# Patient Record
Sex: Female | Born: 1945 | Race: White | Hispanic: No | Smoking: Former smoker
Health system: Southern US, Community
[De-identification: ages and names within clinical notes are randomized; demographics above are authoritative.]

## PROBLEM LIST (undated history)

## (undated) DIAGNOSIS — E119 Type 2 diabetes mellitus without complications: Secondary | ICD-10-CM

## (undated) DIAGNOSIS — E785 Hyperlipidemia, unspecified: Secondary | ICD-10-CM

## (undated) DIAGNOSIS — I10 Essential (primary) hypertension: Secondary | ICD-10-CM

## (undated) DIAGNOSIS — H269 Unspecified cataract: Secondary | ICD-10-CM

## (undated) HISTORY — PX: OTHER SURGICAL HISTORY: SHX169

## (undated) HISTORY — DX: Essential (primary) hypertension: I10

## (undated) HISTORY — PX: TUBAL LIGATION: SHX77

## (undated) HISTORY — DX: Unspecified cataract: H26.9

## (undated) HISTORY — DX: Hyperlipidemia, unspecified: E78.5

## (undated) HISTORY — PX: POLYPECTOMY: SHX149

## (undated) HISTORY — DX: Type 2 diabetes mellitus without complications: E11.9

## (undated) HISTORY — PX: ROTATOR CUFF REPAIR: SHX139

## (undated) HISTORY — PX: BUNIONECTOMY: SHX129

---

## 2014-02-07 HISTORY — PX: COLONOSCOPY: SHX174

## 2014-03-19 ENCOUNTER — Telehealth: Payer: Self-pay | Admitting: Family Medicine

## 2014-03-19 NOTE — Telephone Encounter (Signed)
Sorry but I am too full  

## 2014-03-19 NOTE — Telephone Encounter (Addendum)
Pt would like to know if you will accept as a new pt? Other family members see you as well.  Pt states she has spoken with you.

## 2014-03-21 NOTE — Telephone Encounter (Signed)
Pt is aware and states she will talk to MD herself.

## 2014-12-10 ENCOUNTER — Ambulatory Visit
Admission: RE | Admit: 2014-12-10 | Discharge: 2014-12-10 | Disposition: A | Discharge status: Home / Self Care | Payer: PRIVATE HEALTH INSURANCE | Source: Ambulatory Visit | Attending: Family Medicine | Admitting: Family Medicine

## 2014-12-10 ENCOUNTER — Other Ambulatory Visit: Payer: Self-pay | Admitting: Family Medicine

## 2014-12-10 DIAGNOSIS — M7541 Impingement syndrome of right shoulder: Secondary | ICD-10-CM

## 2014-12-16 ENCOUNTER — Other Ambulatory Visit: Payer: Self-pay | Admitting: Family Medicine

## 2014-12-16 DIAGNOSIS — M25511 Pain in right shoulder: Secondary | ICD-10-CM

## 2014-12-16 DIAGNOSIS — M7541 Impingement syndrome of right shoulder: Secondary | ICD-10-CM

## 2014-12-23 ENCOUNTER — Ambulatory Visit
Admission: RE | Admit: 2014-12-23 | Discharge: 2014-12-23 | Disposition: A | Discharge status: Home / Self Care | Payer: Medicare Other | Source: Ambulatory Visit | Attending: Family Medicine | Admitting: Family Medicine

## 2014-12-23 DIAGNOSIS — M7541 Impingement syndrome of right shoulder: Secondary | ICD-10-CM

## 2014-12-23 DIAGNOSIS — M25511 Pain in right shoulder: Secondary | ICD-10-CM

## 2015-01-08 ENCOUNTER — Other Ambulatory Visit: Payer: PRIVATE HEALTH INSURANCE

## 2015-04-01 ENCOUNTER — Other Ambulatory Visit: Payer: Self-pay | Admitting: Family Medicine

## 2015-04-01 DIAGNOSIS — R5381 Other malaise: Secondary | ICD-10-CM

## 2015-04-11 ENCOUNTER — Other Ambulatory Visit: Payer: Self-pay | Admitting: Family Medicine

## 2015-04-11 ENCOUNTER — Other Ambulatory Visit: Payer: Self-pay | Admitting: Internal Medicine

## 2015-04-11 DIAGNOSIS — Z1231 Encounter for screening mammogram for malignant neoplasm of breast: Secondary | ICD-10-CM

## 2015-04-11 DIAGNOSIS — M8580 Other specified disorders of bone density and structure, unspecified site: Secondary | ICD-10-CM

## 2015-04-23 ENCOUNTER — Ambulatory Visit
Admission: RE | Admit: 2015-04-23 | Discharge: 2015-04-23 | Disposition: A | Discharge status: Home / Self Care | Payer: Medicare Other | Source: Ambulatory Visit | Attending: Internal Medicine | Admitting: Internal Medicine

## 2015-04-23 DIAGNOSIS — M8580 Other specified disorders of bone density and structure, unspecified site: Secondary | ICD-10-CM

## 2015-04-23 DIAGNOSIS — Z1231 Encounter for screening mammogram for malignant neoplasm of breast: Secondary | ICD-10-CM

## 2016-04-14 ENCOUNTER — Other Ambulatory Visit: Payer: Self-pay

## 2016-04-14 DIAGNOSIS — Z1231 Encounter for screening mammogram for malignant neoplasm of breast: Secondary | ICD-10-CM

## 2016-04-30 ENCOUNTER — Ambulatory Visit: Payer: Medicare Other

## 2016-05-04 ENCOUNTER — Ambulatory Visit
Admission: RE | Admit: 2016-05-04 | Discharge: 2016-05-04 | Disposition: A | Discharge status: Home / Self Care | Payer: Medicare Other | Source: Ambulatory Visit

## 2016-05-04 DIAGNOSIS — Z1231 Encounter for screening mammogram for malignant neoplasm of breast: Secondary | ICD-10-CM

## 2017-04-25 ENCOUNTER — Other Ambulatory Visit: Payer: Self-pay | Admitting: Internal Medicine

## 2017-04-25 DIAGNOSIS — Z1231 Encounter for screening mammogram for malignant neoplasm of breast: Secondary | ICD-10-CM

## 2017-05-05 ENCOUNTER — Encounter: Payer: Self-pay | Admitting: Radiology

## 2017-05-05 ENCOUNTER — Ambulatory Visit
Admission: RE | Admit: 2017-05-05 | Discharge: 2017-05-05 | Disposition: A | Discharge status: Home / Self Care | Payer: Medicare Other | Source: Ambulatory Visit | Attending: Internal Medicine | Admitting: Internal Medicine

## 2017-05-05 DIAGNOSIS — Z1231 Encounter for screening mammogram for malignant neoplasm of breast: Secondary | ICD-10-CM

## 2017-11-15 HISTORY — PX: CATARACT EXTRACTION: SUR2

## 2018-04-11 ENCOUNTER — Other Ambulatory Visit: Payer: Self-pay | Admitting: Internal Medicine

## 2018-04-11 DIAGNOSIS — Z1231 Encounter for screening mammogram for malignant neoplasm of breast: Secondary | ICD-10-CM

## 2018-05-08 ENCOUNTER — Ambulatory Visit
Admission: RE | Admit: 2018-05-08 | Discharge: 2018-05-08 | Disposition: A | Discharge status: Home / Self Care | Payer: Medicare Other | Source: Ambulatory Visit | Attending: Internal Medicine | Admitting: Internal Medicine

## 2018-05-08 DIAGNOSIS — Z1231 Encounter for screening mammogram for malignant neoplasm of breast: Secondary | ICD-10-CM

## 2018-12-18 ENCOUNTER — Telehealth: Payer: Self-pay | Admitting: Gastroenterology

## 2018-12-19 ENCOUNTER — Encounter: Payer: Self-pay | Admitting: Gastroenterology

## 2019-01-16 ENCOUNTER — Ambulatory Visit (AMBULATORY_SURGERY_CENTER): Payer: Self-pay

## 2019-01-16 ENCOUNTER — Encounter: Payer: Self-pay | Admitting: Gastroenterology

## 2019-01-16 VITALS — Ht 65.5 in | Wt 199.6 lb

## 2019-01-16 DIAGNOSIS — Z86010 Personal history of colonic polyps: Secondary | ICD-10-CM

## 2019-01-16 MED ORDER — NA SULFATE-K SULFATE-MG SULF 17.5-3.13-1.6 GM/177ML PO SOLN: 1.0000 | Freq: Once | ORAL | 0 refills | Status: AC

## 2019-01-16 NOTE — Progress Notes (Signed)
Denies allergies to eggs or soy products. Denies complication of anesthesia or sedation. Denies use of weight loss medication. Denies use of O2.   Emmi instructions declined.  

## 2019-01-29 ENCOUNTER — Telehealth: Payer: Self-pay | Admitting: Gastroenterology

## 2019-01-29 NOTE — Telephone Encounter (Signed)
Hi Dr. [name] Plan, this pt just cancelled her procedure that was scheduled for tomorrow with you due to concerns about the coronavirus, she stated to have relatives who are healthcare professionals who advised her to do that. She will call back to reschedule. Thank you.

## 2019-01-29 NOTE — Telephone Encounter (Signed)
OK No charge 

## 2019-01-30 ENCOUNTER — Encounter: Payer: Medicare Other | Admitting: Gastroenterology

## 2019-04-03 ENCOUNTER — Other Ambulatory Visit: Payer: Self-pay | Admitting: Internal Medicine

## 2019-04-03 DIAGNOSIS — Z1231 Encounter for screening mammogram for malignant neoplasm of breast: Secondary | ICD-10-CM

## 2019-04-06 ENCOUNTER — Encounter: Payer: Self-pay | Admitting: Gastroenterology

## 2019-04-16 DIAGNOSIS — E119 Type 2 diabetes mellitus without complications: Secondary | ICD-10-CM | POA: Insufficient documentation

## 2019-04-16 DIAGNOSIS — I10 Essential (primary) hypertension: Secondary | ICD-10-CM | POA: Insufficient documentation

## 2019-04-23 NOTE — Telephone Encounter (Signed)
Pt scheduled for 05/08/2019

## 2019-04-25 ENCOUNTER — Ambulatory Visit: Payer: Medicare Other | Admitting: *Deleted

## 2019-04-25 ENCOUNTER — Other Ambulatory Visit: Payer: Self-pay

## 2019-04-25 VITALS — Ht 65.5 in | Wt 196.0 lb

## 2019-04-25 DIAGNOSIS — Z86010 Personal history of colonic polyps: Secondary | ICD-10-CM

## 2019-04-25 NOTE — Progress Notes (Signed)
Patient's pre-visit was done today over the phone with the patient due to Covid-19. Name,DOB and address verified. Insurance verified. Packet of Prep instructions mailed to patient including copy of a consent form and pre-procedure patient acknowledgement form-pt is aware. Patient has Suprep at home. Patient understands to call us back with any questions or concerns.   Patient denies any allergies to eggs or soy. Patient denies any problems with anesthesia/sedation. Patient denies any oxygen use at home. Patient denies taking any diet/weight loss medications or blood thinners. EMMI education assisgned to patient on colonoscopy, this was explained and instructions given to patient.

## 2019-05-07 ENCOUNTER — Telehealth: Payer: Self-pay | Admitting: Gastroenterology

## 2019-05-07 NOTE — Telephone Encounter (Signed)

## 2019-05-08 ENCOUNTER — Ambulatory Visit (AMBULATORY_SURGERY_CENTER): Payer: Medicare Other | Admitting: Gastroenterology

## 2019-05-08 ENCOUNTER — Encounter: Payer: Self-pay | Admitting: Gastroenterology

## 2019-05-08 ENCOUNTER — Other Ambulatory Visit: Payer: Self-pay

## 2019-05-08 VITALS — BP 109/67 | HR 70 | Temp 98.8°F | Resp 15 | Ht 65.0 in | Wt 196.0 lb

## 2019-05-08 DIAGNOSIS — D123 Benign neoplasm of transverse colon: Secondary | ICD-10-CM | POA: Diagnosis not present

## 2019-05-08 DIAGNOSIS — D122 Benign neoplasm of ascending colon: Secondary | ICD-10-CM | POA: Diagnosis not present

## 2019-05-08 DIAGNOSIS — Z86010 Personal history of colonic polyps: Secondary | ICD-10-CM | POA: Diagnosis not present

## 2019-05-08 DIAGNOSIS — K635 Polyp of colon: Secondary | ICD-10-CM

## 2019-05-08 MED ORDER — SODIUM CHLORIDE 0.9 % IV SOLN: 500.0000 mL | Freq: Once | INTRAVENOUS | Status: DC

## 2019-05-08 NOTE — Patient Instructions (Signed)
NO NSAIDS OR ASPIRIN FOR 2 WEEKS.  PLEASE TAKE TYLENOL (ACETAMINOPHEN) OTC IF NEEDED FOR PAIN. NO IBUPROFEN, ALEVE, NAPROSYN, GOODY POWDERS FOR 2 WEEKS. PLEASE FOLLOW A HIGH FIBER DIET.    YOU HAD AN ENDOSCOPIC PROCEDURE TODAY AT [facility]:   Refer to the procedure report that was given to you for any specific questions about what was found during the examination.  If the procedure report does not answer your questions, please call your gastroenterologist to clarify.  If you requested that your care partner not be given the details of your procedure findings, then the procedure report has been included in a sealed envelope for you to review at your convenience later.  YOU SHOULD EXPECT: Some feelings of bloating in the abdomen. Passage of more gas than usual.  Walking can help get rid of the air that was put into your GI tract during the procedure and reduce the bloating. If you had a lower endoscopy (such as a colonoscopy or flexible sigmoidoscopy) you may notice spotting of blood in your stool or on the toilet paper. If you underwent a bowel prep for your procedure, you may not have a normal bowel movement for a few days.  Please Note:  You might notice some irritation and congestion in your nose or some drainage.  This is from the oxygen used during your procedure.  There is no need for concern and it should clear up in a day or so.  SYMPTOMS TO REPORT IMMEDIATELY:   Following lower endoscopy (colonoscopy or flexible sigmoidoscopy):  Excessive amounts of blood in the stool  Significant tenderness or worsening of abdominal pains  Swelling of the abdomen that is new, acute  Fever of 100F or higher   For urgent or emergent issues, a gastroenterologist can be reached at any hour by calling [phone].   DIET:  We do recommend a small meal at first, but then you may proceed to your regular diet.  Drink plenty of fluids but you should avoid alcoholic beverages for 24  hours.  ACTIVITY:  You should plan to take it easy for the rest of today and you should NOT DRIVE or use heavy machinery until tomorrow (because of the sedation medicines used during the test).    FOLLOW UP: Our staff will call the number listed on your records 48-72 hours following your procedure to check on you and address any questions or concerns that you may have regarding the information given to you following your procedure. If we do not reach you, we will leave a message.  We will attempt to reach you two times.  During this call, we will ask if you have developed any symptoms of COVID 19. If you develop any symptoms (ie: fever, flu-like symptoms, shortness of breath, cough etc.) before then, please call [phone].  If you test positive for Covid 19 in the 2 weeks post procedure, please call and report this information to [place].    If any biopsies were taken you will be contacted by phone or by letter within the next 1-3 weeks.  Please call us at [phone] if you have not heard about the biopsies in 3 weeks.    SIGNATURES/CONFIDENTIALITY: You and/or your care partner have signed paperwork which will be entered into your electronic medical record.  These signatures attest to the fact that that the information above on your After Visit Summary has been reviewed and is understood.  Full responsibility of the confidentiality of this  discharge information lies with you and/or your care-partner. 

## 2019-05-08 NOTE — Progress Notes (Signed)
Pt. Reports no change in her medical or surgical history since her pre-visit 04/25/2019.  VS and Temp checked by Raven and JB.

## 2019-05-08 NOTE — Progress Notes (Signed)
Report to PACU, RN, vss, BBS= Clear.  

## 2019-05-08 NOTE — Op Note (Addendum)
[facility] Patient Name: Kelly Jordan Procedure Date: 05/08/2019 7:40 AM MRN: [id number] Endoscopist: Ladene Artist , MD Age: 73 Referring MD:  Date of Birth: [birth year] Gender: Female Account #: [id number] Procedure:                Colonoscopy Indications:              Surveillance: Personal history of adenomatous                            polyps and SSA on last colonoscopy 5 years ago Medicines:                Monitored Anesthesia Care Procedure:                Pre-Anesthesia Assessment:                           - Prior to the procedure, a History and Physical                            was performed, and patient medications and                            allergies were reviewed. The patient's tolerance of                            previous anesthesia was also reviewed. The risks                            and benefits of the procedure and the sedation                            options and risks were discussed with the patient.                            All questions were answered, and informed consent                            was obtained. Prior Anticoagulants: The patient has                            taken no previous anticoagulant or antiplatelet                            agents. ASA Grade Assessment: II - A patient with                            mild systemic disease. After reviewing the risks                            and benefits, the patient was deemed in                            satisfactory condition to undergo the procedure.  After obtaining informed consent, the colonoscope                            was passed under direct vision. Throughout the                            procedure, the patient's blood pressure, pulse, and                            oxygen saturations were monitored continuously. The                            Colonoscope was introduced through the anus and                            advanced to the  the cecum, identified by                            appendiceal orifice and ileocecal valve. The                            ileocecal valve, appendiceal orifice, and rectum                            were photographed. The quality of the bowel                            preparation was excellent. The colonoscopy was                            performed without difficulty. The patient tolerated                            the procedure well. Scope In: 8:09:41 AM Scope Out: 8:29:15 AM Scope Withdrawal Time: 0 hours 16 minutes 47 seconds  Total Procedure Duration: 0 hours 19 minutes 34 seconds  Findings:                 The perianal and digital rectal examinations were                            normal.                           A 10 mm polyp was found in the ascending colon. The                            polyp was sessile. The polyp was removed with a hot                            snare. Resection and retrieval were complete.                           A 4 mm polyp was found in the ascending colon. The  polyp was sessile. The polyp was removed with a                            cold biopsy forceps. Resection and retrieval were                            complete.                           A 7 mm polyp was found in the transverse colon. The                            polyp was sessile. The polyp was removed with a                            cold snare. Resection and retrieval were complete.                           A few small-mouthed diverticula were found in the                            sigmoid colon.                           Anal papilla(e) were hypertrophied.                           Internal hemorrhoids were found during                            retroflexion. The hemorrhoids were small and Grade                            I (internal hemorrhoids that do not prolapse).                           The colon otherwise appeared normal on direct and                             retroflexion views. Complications:            No immediate complications. Estimated blood loss:                            None. Estimated Blood Loss:     Estimated blood loss: none. Impression:               - One 10 mm polyp in the ascending colon, removed                            with a hot snare. Resected and retrieved.                           - One 4 mm polyp in the ascending colon, removed  with a cold biopsy forceps. Resected and retrieved.                           - One 7 mm polyp in the transverse colon, removed                            with a cold snare. Resected and retrieved.                           - Mild diverticulosis in the sigmoid colon.                           - Anal papilla(e) were hypertrophied.                           - Internal hemorrhoids.                           - The colon otherwise appeared normal on direct and                            retroflexion views. Recommendation:           - Repeat colonoscopy in 3 - 5 years for                            surveillance pending patholgyy review.                           - Patient has a contact number available for                            emergencies. The signs and symptoms of potential                            delayed complications were discussed with the                            patient. Return to normal activities tomorrow.                            Written discharge instructions were provided to the                            patient.                           - High fiber diet.                           - Continue present medications.                           - Await pathology results.                           - No  aspirin, ibuprofen, naproxen, or other                            non-steroidal anti-inflammatory drugs for 2 weeks                            after polyp removal. Ladene Artist, MD 05/08/2019 8:35:10 AM This report has been signed  electronically.

## 2019-05-08 NOTE — Progress Notes (Signed)
Called to room to assist during endoscopic procedure.  Patient ID and intended procedure confirmed with present staff. Received instructions for my participation in the procedure from the performing physician.  

## 2019-05-10 ENCOUNTER — Other Ambulatory Visit: Payer: Self-pay

## 2019-05-10 ENCOUNTER — Other Ambulatory Visit: Payer: Self-pay | Admitting: Podiatry

## 2019-05-10 ENCOUNTER — Ambulatory Visit: Payer: Medicare Other | Admitting: Podiatry

## 2019-05-10 ENCOUNTER — Encounter: Payer: Self-pay | Admitting: Podiatry

## 2019-05-10 ENCOUNTER — Ambulatory Visit (INDEPENDENT_AMBULATORY_CARE_PROVIDER_SITE_OTHER): Payer: Medicare Other

## 2019-05-10 ENCOUNTER — Telehealth: Payer: Self-pay

## 2019-05-10 VITALS — BP 141/76 | HR 73 | Temp 97.1°F | Resp 16

## 2019-05-10 DIAGNOSIS — M76821 Posterior tibial tendinitis, right leg: Secondary | ICD-10-CM | POA: Diagnosis not present

## 2019-05-10 DIAGNOSIS — M7750 Other enthesopathy of unspecified foot: Secondary | ICD-10-CM

## 2019-05-10 MED ORDER — MELOXICAM 15 MG PO TABS: 15.0000 mg | ORAL_TABLET | Freq: Every day | ORAL | 3 refills | Status: DC

## 2019-05-10 NOTE — Progress Notes (Signed)
Subjective:  Patient ID: Kelly Jordan, female    DOB: 02-Mar-1946,  MRN: [id number] HPI Chief Complaint  Patient presents with  . Foot Pain    Medial ankle right - aching, burning x 6 months, swelling on medial and lateral side of ankle, no injury, taking advil as needed  . New Patient (Initial Visit)    73 y.o. female presents with the above complaint.   ROS: Denies fever chills nausea vomiting muscle aches pains calf pain back pain chest pain shortness of breath.  Past Medical History:  Diagnosis Date  . Cataract   . Diabetes mellitus without complication ([place])   . Hyperlipidemia   . Hypertension    Past Surgical History:  Procedure Laterality Date  . Blefoplasty  Bilateral   . BUNIONECTOMY Bilateral   . CATARACT EXTRACTION Bilateral 2019  . COLONOSCOPY  02/07/2014  . POLYPECTOMY    . ROTATOR CUFF REPAIR Right     Current Outpatient Medications:  .  aspirin EC 81 MG tablet, Take 81 mg by mouth daily., Disp: , Rfl:  .  glipiZIDE (GLUCOTROL) 5 MG tablet, Take by mouth daily before breakfast., Disp: , Rfl:  .  meloxicam (MOBIC) 15 MG tablet, Take 1 tablet (15 mg total) by mouth daily., Disp: 30 tablet, Rfl: 3 .  metFORMIN (GLUCOPHAGE) 1000 MG tablet, Take 1,000 mg by mouth 2 (two) times daily with a meal., Disp: , Rfl:  .  rosuvastatin (CRESTOR) 10 MG tablet, Take 10 mg by mouth daily., Disp: , Rfl:  .  sitaGLIPtin (JANUVIA) 100 MG tablet, Take 100 mg by mouth daily., Disp: , Rfl:  .  telmisartan (MICARDIS) 20 MG tablet, Take 20 mg by mouth daily., Disp: , Rfl:   No Known Allergies Review of Systems Objective:   Vitals:   05/10/19 1325  BP: (!) 141/76  Pulse: 73  Resp: 16  Temp: (!) 97.1 F (36.2 C)    General: Well developed, nourished, in no acute distress, alert and oriented x3   Dermatological: Skin is warm, dry and supple bilateral. Nails x 10 are well maintained; remaining integument appears unremarkable at this time. There are no open sores, no  preulcerative lesions, no rash or signs of infection present.  Vascular: Dorsalis Pedis artery and Posterior Tibial artery pedal pulses are 2/4 bilateral with immedate capillary fill time. Pedal hair growth present. No varicosities and no lower extremity edema present bilateral.   Neruologic: Grossly intact via light touch bilateral. Vibratory intact via tuning fork bilateral. Protective threshold with Semmes Wienstein monofilament intact to all pedal sites bilateral. Patellar and Achilles deep tendon reflexes 2+ bilateral. No Babinski or clonus noted bilateral.   Musculoskeletal: No gross boney pedal deformities bilateral. No pain, crepitus, or limitation noted with foot and ankle range of motion bilateral. Muscular strength 5/5 in all groups tested bilateral.  Gait: Unassisted, Nonantalgic.    Radiographs:  Radiographs taken today demonstrate pes planus with dorsal lipping of the distal aspect of the talar head she also has considerable sclerosis of the middle subtalar joint facet.  She also has soft tissue swelling around the posterior tibial tendon and spring ligament easily visible radiograph.  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  Plan: Discussed etiology pathology conservative versus surgical therapies.  At this point I injected the posterior tibial tendon area with 20 mg Kenalog 5 mg Marcaine started on meloxicam 15 mg 1 p.o. daily recommended appropriate shoe gear  compression anklet and a Tri-Lock brace.  I will follow-up with her when she returns from [place].     Kelly Lurry T. [place], [place]

## 2019-05-10 NOTE — Telephone Encounter (Signed)
  Follow up Call-  Call back number 05/08/2019  Post procedure Call Back phone  # [phone]  Permission to leave phone message Yes  Some recent data might be hidden     Patient questions:  Do you have a fever, pain , or abdominal swelling? No. Pain Score  0 *  Have you tolerated food without any problems? Yes.    Have you been able to return to your normal activities? Yes.    Do you have any questions about your discharge instructions: Diet   No. Medications  No. Follow up visit  No.  Do you have questions or concerns about your Care? No.  Actions: * If pain score is 4 or above: No action needed, pain <4.  1. Have you developed a fever since your procedure? no  2.   Have you had an respiratory symptoms (SOB or cough) since your procedure? no  3.   Have you tested positive for COVID 19 since your procedure no  4.   Have you had any family members/close contacts diagnosed with the COVID 19 since your procedure?  no   If yes to any of these questions please route to Joylene John, RN and Alphonsa Gin, Therapist, sports.

## 2019-05-16 ENCOUNTER — Encounter: Payer: Self-pay | Admitting: Gastroenterology

## 2019-06-05 ENCOUNTER — Encounter: Payer: Self-pay | Admitting: Podiatry

## 2019-06-05 ENCOUNTER — Other Ambulatory Visit: Payer: Self-pay

## 2019-06-05 ENCOUNTER — Ambulatory Visit: Payer: Medicare Other | Admitting: Podiatry

## 2019-06-05 VITALS — Temp 97.5°F

## 2019-06-05 DIAGNOSIS — M76821 Posterior tibial tendinitis, right leg: Secondary | ICD-10-CM

## 2019-06-05 NOTE — Progress Notes (Signed)
She presents today for follow-up of her posterior tibial tendinitis right she states is doing so much better.  She states that she was not able to go to [place] because her mother died 2023/06/12.  She states that she has not had any pain as long she wears her brace.  Objective: Vital signs are stable alert and oriented x3.  There is no erythema edema cellulitis drainage odor no fluctuance within the tendon sheath.  Assessment: Resolving posterior tibial tendinitis right.  Plan: Encouraged her to continue to wear the brace for at least another month and I will follow-up with her when she returns from [place] she will be leaving 4 August

## 2019-06-18 ENCOUNTER — Ambulatory Visit
Admission: RE | Admit: 2019-06-18 | Discharge: 2019-06-18 | Disposition: A | Discharge status: Home / Self Care | Payer: Medicare Other | Source: Ambulatory Visit | Attending: Internal Medicine | Admitting: Internal Medicine

## 2019-06-18 ENCOUNTER — Other Ambulatory Visit: Payer: Self-pay

## 2019-06-18 DIAGNOSIS — Z1231 Encounter for screening mammogram for malignant neoplasm of breast: Secondary | ICD-10-CM

## 2019-06-19 ENCOUNTER — Ambulatory Visit: Payer: Medicare Other | Admitting: Podiatry

## 2019-08-20 ENCOUNTER — Other Ambulatory Visit: Payer: Self-pay | Admitting: *Deleted

## 2019-08-20 DIAGNOSIS — Z20822 Contact with and (suspected) exposure to covid-19: Secondary | ICD-10-CM

## 2019-08-22 LAB — NOVEL CORONAVIRUS, NAA: SARS-CoV-2, NAA: NOT DETECTED

## 2019-09-15 IMAGING — MG DIGITAL SCREENING BILATERAL MAMMOGRAM WITH TOMO AND CAD
8 series · 8 of 24 positions shown · non-contrast
Comparison: Previous exam(s).

ACR Breast Density Category a: The breast tissue is almost entirely
fatty.

CLINICAL DATA: Screening.

EXAM:
DIGITAL SCREENING BILATERAL MAMMOGRAM WITH TOMO AND CAD

[R CC synth-2D]
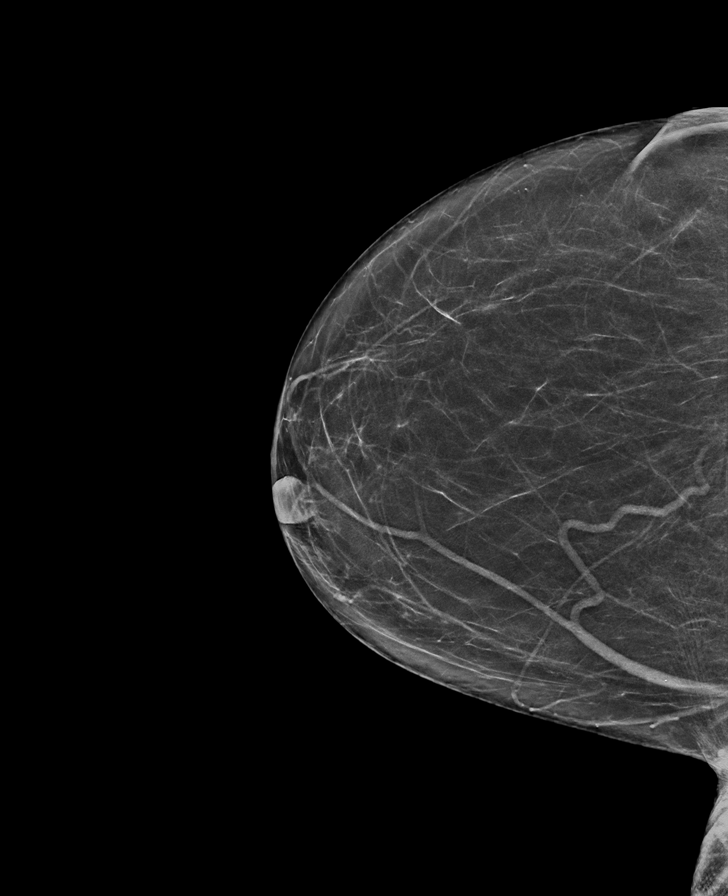

[R MLO synth-2D]
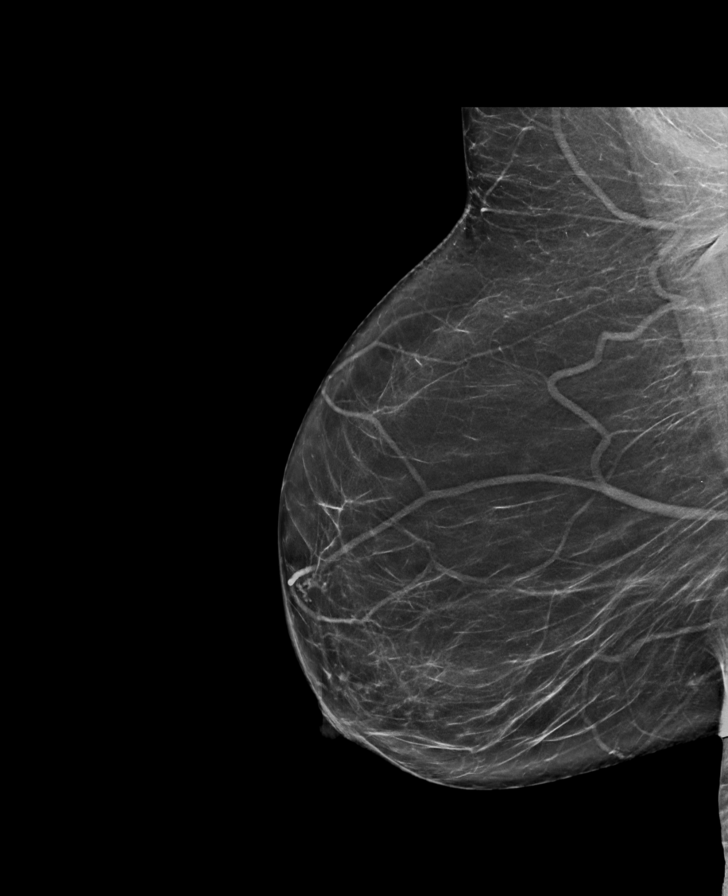

[L CC synth-2D]
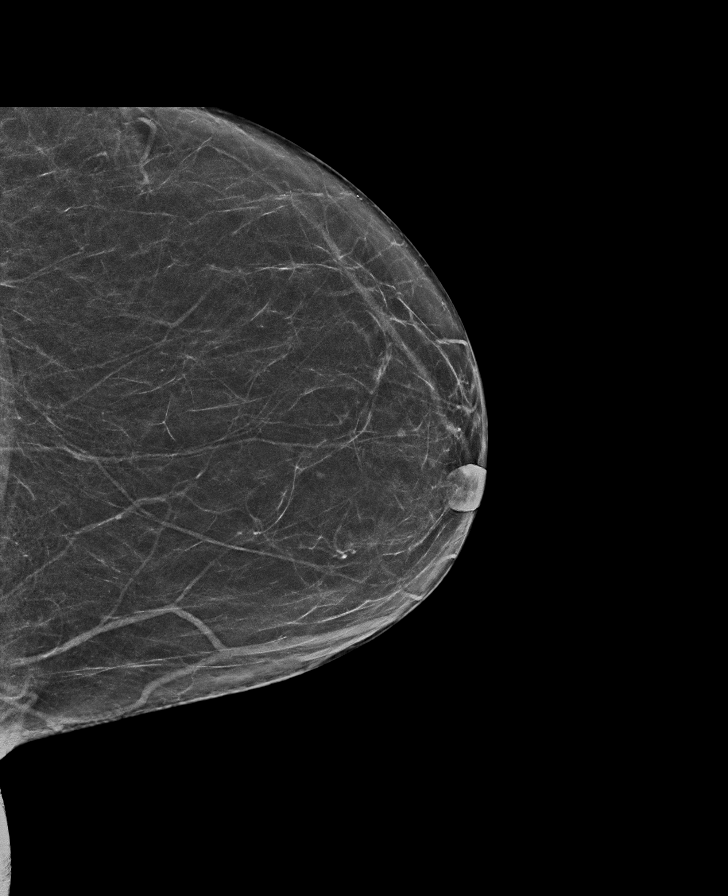

[L MLO synth-2D]
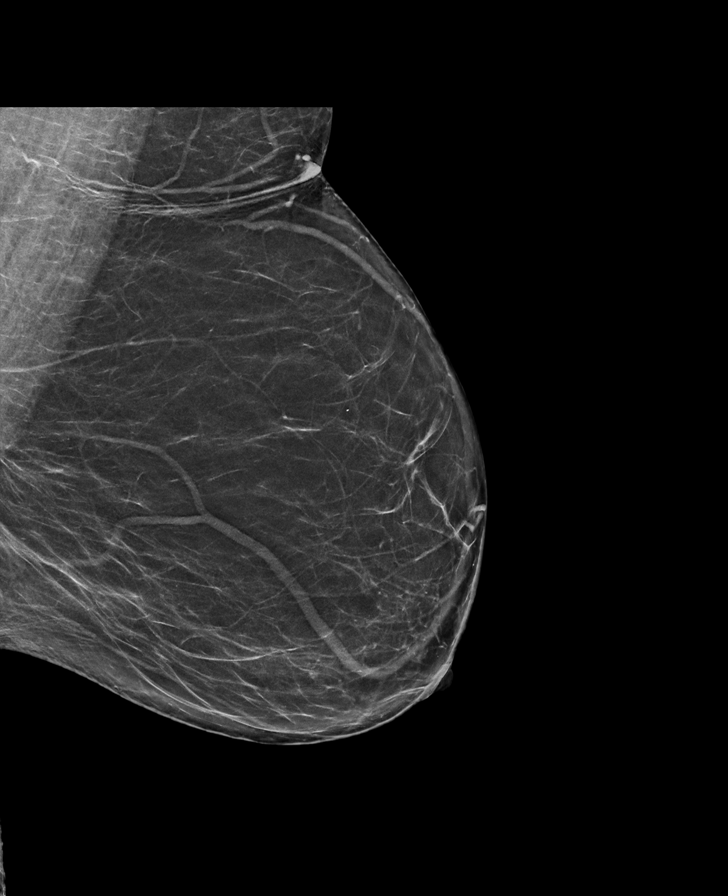

[R CC tomo · tomo slice 33/64.0]
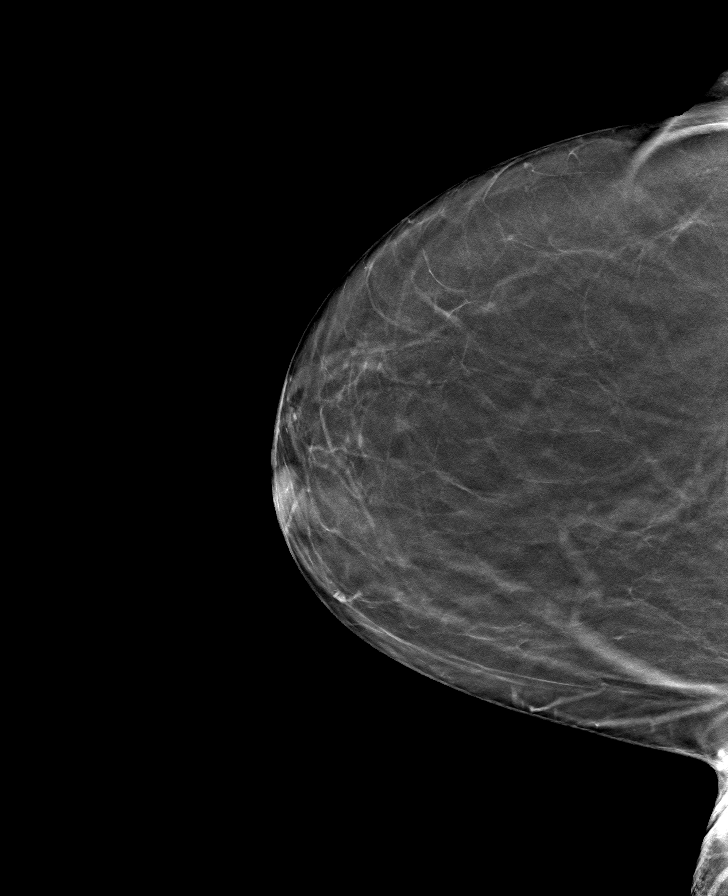

[L MLO tomo · tomo slice 35/69.0]
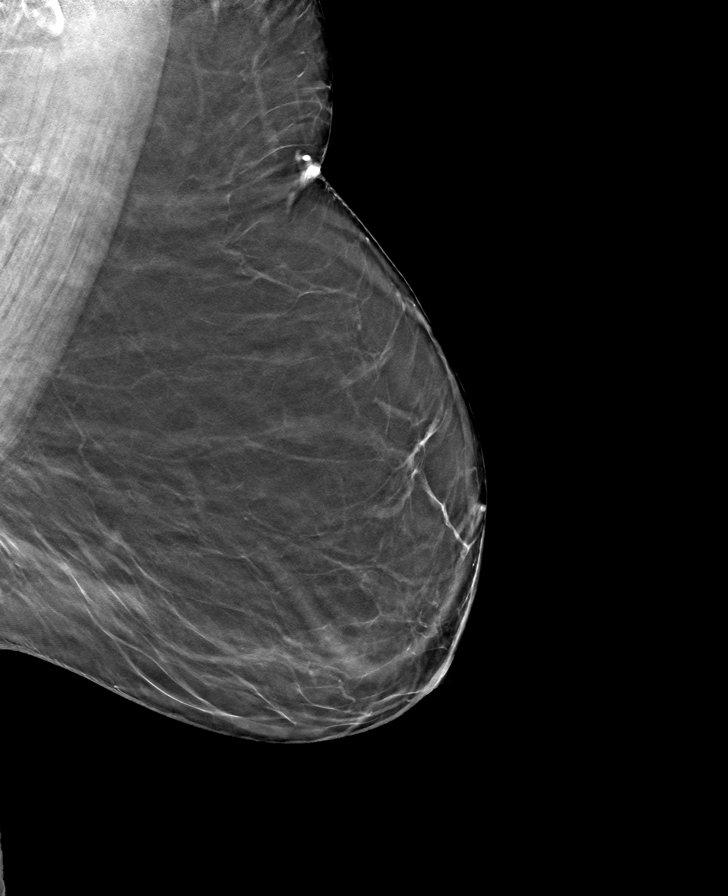

[R MLO tomo · tomo slice 36/71.0]
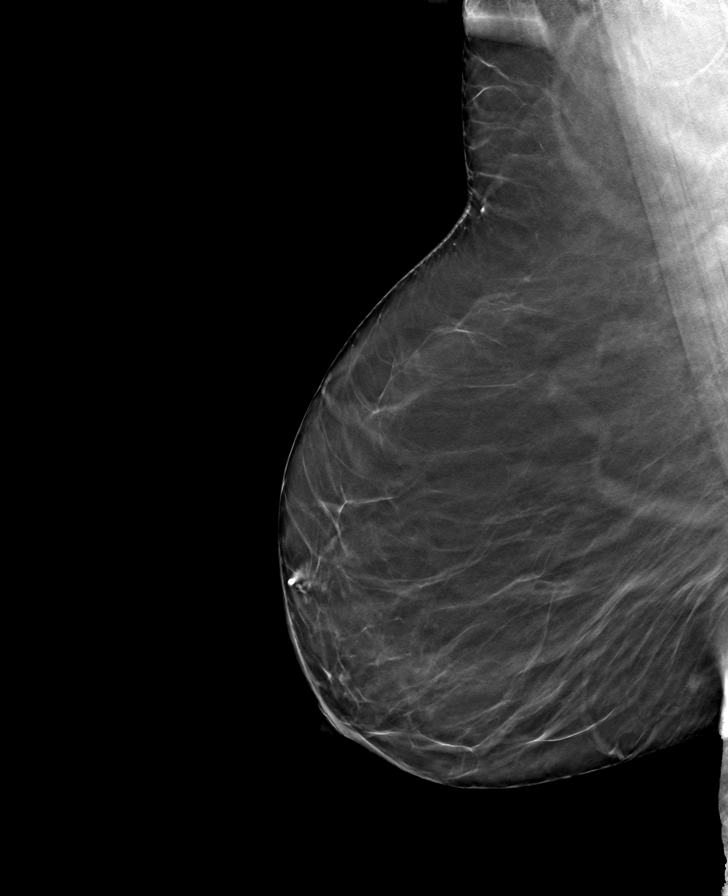

[L CC tomo · tomo slice 33/65.0]
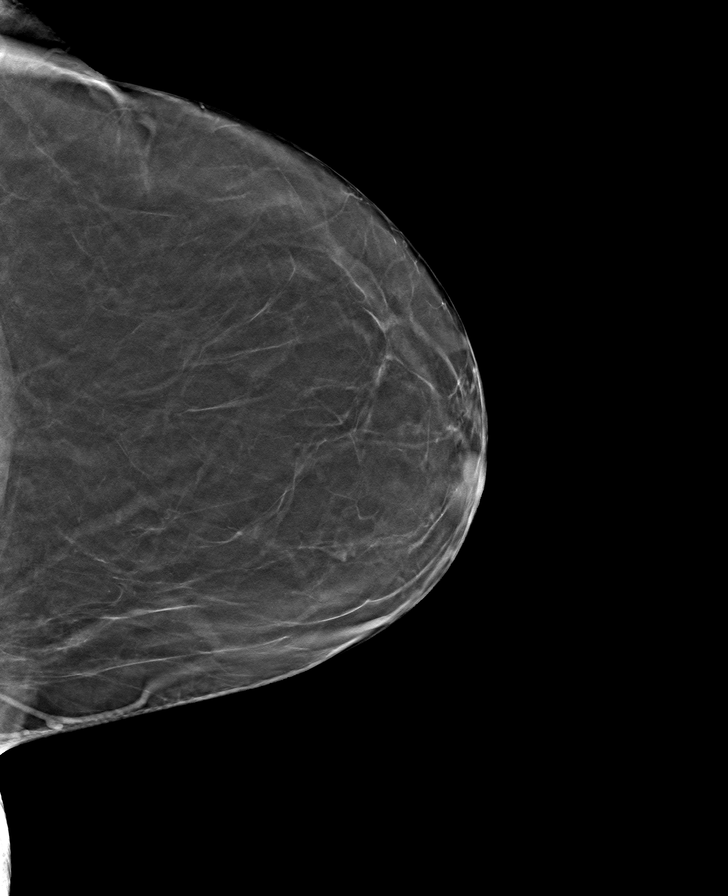

[8 of 24 positions shown; findings below may reference images not displayed]

FINDINGS: There are no findings suspicious for malignancy. Images were
processed with CAD.
IMPRESSION: No mammographic evidence of malignancy. A result letter of this
screening mammogram will be mailed directly to the patient.

RECOMMENDATION:
Screening mammogram in one year. (Code:8Y-[id number])

BI-RADS CATEGORY  1: Negative.

## 2019-11-22 DIAGNOSIS — E118 Type 2 diabetes mellitus with unspecified complications: Secondary | ICD-10-CM | POA: Diagnosis not present

## 2019-11-26 DIAGNOSIS — E1169 Type 2 diabetes mellitus with other specified complication: Secondary | ICD-10-CM | POA: Diagnosis not present

## 2019-11-26 DIAGNOSIS — I10 Essential (primary) hypertension: Secondary | ICD-10-CM | POA: Diagnosis not present

## 2019-11-26 DIAGNOSIS — E782 Mixed hyperlipidemia: Secondary | ICD-10-CM | POA: Diagnosis not present

## 2019-12-10 ENCOUNTER — Other Ambulatory Visit: Payer: Self-pay

## 2019-12-10 ENCOUNTER — Ambulatory Visit: Payer: Medicare PPO | Attending: Internal Medicine

## 2019-12-10 DIAGNOSIS — Z23 Encounter for immunization: Secondary | ICD-10-CM

## 2019-12-10 NOTE — Progress Notes (Signed)
   Covid-19 Vaccination Clinic  Name:  Quadira Santos    MRN: [id number] DOB: 1946-01-08  12/10/2019  Ms. Kranich was observed post Covid-19 immunization for 15 minutes without incidence. She was provided with Vaccine Information Sheet and instruction to access the V-Safe system.   Ms. Sipp was instructed to call 911 with any severe reactions post vaccine: Marland Kitchen Difficulty breathing  . Swelling of your face and throat  . A fast heartbeat  . A bad rash all over your body  . Dizziness and weakness    Immunizations Administered    Name Date Dose VIS Date Route   Pfizer COVID-19 Vaccine 12/10/2019  8:40 AM 0.3 mL 10/26/2019 Intramuscular   Manufacturer: [place]   Lot: [id number]   [place]: [id number]

## 2019-12-31 ENCOUNTER — Ambulatory Visit: Payer: Medicare PPO | Attending: Internal Medicine

## 2019-12-31 DIAGNOSIS — Z23 Encounter for immunization: Secondary | ICD-10-CM

## 2019-12-31 NOTE — Progress Notes (Signed)
   Covid-19 Vaccination Clinic  Name:  Kelly Jordan    MRN: [id number] DOB: [birth year]  12/31/2019  [name] was observed post Covid-19 immunization for 15 minutes without incidence. She was provided with Vaccine Information Sheet and instruction to access the V-Safe system.   [name] was instructed to call 911 with any severe reactions post vaccine: Marland Kitchen Difficulty breathing  . Swelling of your face and throat  . A fast heartbeat  . A bad rash all over your body  . Dizziness and weakness    Immunizations Administered    Name Date Dose VIS Date Route   Pfizer COVID-19 Vaccine 12/31/2019  9:08 AM 0.3 mL 10/26/2019 Intramuscular   Manufacturer: [place]   Lot: [id number]   [place]: [id number]

## 2020-01-10 DIAGNOSIS — E1169 Type 2 diabetes mellitus with other specified complication: Secondary | ICD-10-CM | POA: Diagnosis not present

## 2020-01-10 DIAGNOSIS — M8580 Other specified disorders of bone density and structure, unspecified site: Secondary | ICD-10-CM | POA: Diagnosis not present

## 2020-01-10 DIAGNOSIS — E782 Mixed hyperlipidemia: Secondary | ICD-10-CM | POA: Diagnosis not present

## 2020-01-10 DIAGNOSIS — Z780 Asymptomatic menopausal state: Secondary | ICD-10-CM | POA: Diagnosis not present

## 2020-01-10 DIAGNOSIS — Z0000 Encounter for general adult medical examination without abnormal findings: Secondary | ICD-10-CM | POA: Diagnosis not present

## 2020-01-10 DIAGNOSIS — E559 Vitamin D deficiency, unspecified: Secondary | ICD-10-CM | POA: Diagnosis not present

## 2020-01-10 DIAGNOSIS — I10 Essential (primary) hypertension: Secondary | ICD-10-CM | POA: Diagnosis not present

## 2020-01-10 LAB — HM DEXA SCAN

## 2020-01-17 DIAGNOSIS — E1169 Type 2 diabetes mellitus with other specified complication: Secondary | ICD-10-CM | POA: Diagnosis not present

## 2020-01-17 DIAGNOSIS — E1165 Type 2 diabetes mellitus with hyperglycemia: Secondary | ICD-10-CM | POA: Diagnosis not present

## 2020-01-17 DIAGNOSIS — Z0000 Encounter for general adult medical examination without abnormal findings: Secondary | ICD-10-CM | POA: Diagnosis not present

## 2020-01-17 DIAGNOSIS — I10 Essential (primary) hypertension: Secondary | ICD-10-CM | POA: Diagnosis not present

## 2020-01-17 DIAGNOSIS — Z6830 Body mass index (BMI) 30.0-30.9, adult: Secondary | ICD-10-CM | POA: Diagnosis not present

## 2020-01-17 DIAGNOSIS — E782 Mixed hyperlipidemia: Secondary | ICD-10-CM | POA: Diagnosis not present

## 2020-02-14 DIAGNOSIS — Z1152 Encounter for screening for COVID-19: Secondary | ICD-10-CM | POA: Diagnosis not present

## 2020-03-05 DIAGNOSIS — I10 Essential (primary) hypertension: Secondary | ICD-10-CM | POA: Diagnosis not present

## 2020-03-05 DIAGNOSIS — E782 Mixed hyperlipidemia: Secondary | ICD-10-CM | POA: Diagnosis not present

## 2020-03-05 DIAGNOSIS — E1165 Type 2 diabetes mellitus with hyperglycemia: Secondary | ICD-10-CM | POA: Diagnosis not present

## 2020-03-11 DIAGNOSIS — E1169 Type 2 diabetes mellitus with other specified complication: Secondary | ICD-10-CM | POA: Diagnosis not present

## 2020-03-11 DIAGNOSIS — E782 Mixed hyperlipidemia: Secondary | ICD-10-CM | POA: Diagnosis not present

## 2020-03-11 DIAGNOSIS — I10 Essential (primary) hypertension: Secondary | ICD-10-CM | POA: Diagnosis not present

## 2020-04-16 DIAGNOSIS — H5203 Hypermetropia, bilateral: Secondary | ICD-10-CM | POA: Diagnosis not present

## 2020-04-16 DIAGNOSIS — E119 Type 2 diabetes mellitus without complications: Secondary | ICD-10-CM | POA: Diagnosis not present

## 2020-04-16 DIAGNOSIS — H35371 Puckering of macula, right eye: Secondary | ICD-10-CM | POA: Diagnosis not present

## 2020-04-16 DIAGNOSIS — Z961 Presence of intraocular lens: Secondary | ICD-10-CM | POA: Diagnosis not present

## 2020-04-16 LAB — HM DIABETES EYE EXAM

## 2020-05-29 DIAGNOSIS — E1169 Type 2 diabetes mellitus with other specified complication: Secondary | ICD-10-CM | POA: Diagnosis not present

## 2020-05-29 DIAGNOSIS — Z6830 Body mass index (BMI) 30.0-30.9, adult: Secondary | ICD-10-CM | POA: Diagnosis not present

## 2020-05-29 DIAGNOSIS — E782 Mixed hyperlipidemia: Secondary | ICD-10-CM | POA: Diagnosis not present

## 2020-05-29 DIAGNOSIS — I10 Essential (primary) hypertension: Secondary | ICD-10-CM | POA: Diagnosis not present

## 2020-05-29 DIAGNOSIS — E1165 Type 2 diabetes mellitus with hyperglycemia: Secondary | ICD-10-CM | POA: Diagnosis not present

## 2020-06-05 ENCOUNTER — Other Ambulatory Visit: Payer: Self-pay | Admitting: Internal Medicine

## 2020-06-05 DIAGNOSIS — E1165 Type 2 diabetes mellitus with hyperglycemia: Secondary | ICD-10-CM | POA: Diagnosis not present

## 2020-06-05 DIAGNOSIS — Z1231 Encounter for screening mammogram for malignant neoplasm of breast: Secondary | ICD-10-CM

## 2020-06-05 DIAGNOSIS — E119 Type 2 diabetes mellitus without complications: Secondary | ICD-10-CM | POA: Diagnosis not present

## 2020-06-05 DIAGNOSIS — E1169 Type 2 diabetes mellitus with other specified complication: Secondary | ICD-10-CM | POA: Diagnosis not present

## 2020-06-05 DIAGNOSIS — I10 Essential (primary) hypertension: Secondary | ICD-10-CM | POA: Diagnosis not present

## 2020-06-05 DIAGNOSIS — E782 Mixed hyperlipidemia: Secondary | ICD-10-CM | POA: Diagnosis not present

## 2020-06-18 ENCOUNTER — Ambulatory Visit: Payer: Medicare PPO

## 2020-06-26 ENCOUNTER — Ambulatory Visit
Admission: RE | Admit: 2020-06-26 | Discharge: 2020-06-26 | Disposition: A | Discharge status: Home / Self Care | Payer: Medicare PPO | Source: Ambulatory Visit | Attending: Internal Medicine | Admitting: Internal Medicine

## 2020-06-26 ENCOUNTER — Other Ambulatory Visit: Payer: Self-pay

## 2020-06-26 DIAGNOSIS — Z1231 Encounter for screening mammogram for malignant neoplasm of breast: Secondary | ICD-10-CM

## 2020-10-16 DIAGNOSIS — I10 Essential (primary) hypertension: Secondary | ICD-10-CM | POA: Diagnosis not present

## 2020-10-16 DIAGNOSIS — E1165 Type 2 diabetes mellitus with hyperglycemia: Secondary | ICD-10-CM | POA: Diagnosis not present

## 2020-10-16 DIAGNOSIS — E782 Mixed hyperlipidemia: Secondary | ICD-10-CM | POA: Diagnosis not present

## 2020-10-16 DIAGNOSIS — E1169 Type 2 diabetes mellitus with other specified complication: Secondary | ICD-10-CM | POA: Diagnosis not present

## 2020-10-23 DIAGNOSIS — E1169 Type 2 diabetes mellitus with other specified complication: Secondary | ICD-10-CM | POA: Diagnosis not present

## 2020-10-23 DIAGNOSIS — E669 Obesity, unspecified: Secondary | ICD-10-CM | POA: Diagnosis not present

## 2020-10-23 DIAGNOSIS — I10 Essential (primary) hypertension: Secondary | ICD-10-CM | POA: Diagnosis not present

## 2020-10-23 DIAGNOSIS — E782 Mixed hyperlipidemia: Secondary | ICD-10-CM | POA: Diagnosis not present

## 2020-12-11 DIAGNOSIS — I10 Essential (primary) hypertension: Secondary | ICD-10-CM | POA: Diagnosis not present

## 2020-12-11 DIAGNOSIS — E1169 Type 2 diabetes mellitus with other specified complication: Secondary | ICD-10-CM | POA: Diagnosis not present

## 2020-12-11 DIAGNOSIS — E782 Mixed hyperlipidemia: Secondary | ICD-10-CM | POA: Diagnosis not present

## 2021-01-12 ENCOUNTER — Encounter: Payer: Self-pay | Admitting: Family Medicine

## 2021-01-12 ENCOUNTER — Ambulatory Visit: Payer: Medicare PPO | Admitting: Family Medicine

## 2021-01-12 ENCOUNTER — Other Ambulatory Visit: Payer: Self-pay

## 2021-01-12 VITALS — BP 120/80 | HR 86 | Temp 97.9°F | Ht 64.0 in | Wt 187.8 lb

## 2021-01-12 DIAGNOSIS — E669 Obesity, unspecified: Secondary | ICD-10-CM | POA: Diagnosis not present

## 2021-01-12 DIAGNOSIS — E1169 Type 2 diabetes mellitus with other specified complication: Secondary | ICD-10-CM | POA: Diagnosis not present

## 2021-01-12 DIAGNOSIS — I10 Essential (primary) hypertension: Secondary | ICD-10-CM

## 2021-01-12 DIAGNOSIS — E119 Type 2 diabetes mellitus without complications: Secondary | ICD-10-CM

## 2021-01-12 DIAGNOSIS — E782 Mixed hyperlipidemia: Secondary | ICD-10-CM

## 2021-01-12 NOTE — Patient Instructions (Signed)
Was a pleasure meeting you today.  Make sure you are limiting your carbohydrates to 45-60 per meal and 15 for snacks. Try to avoid sugary beverages.  It is recommended that you get at least 150 minutes of physical activity each week.  This could be broken down however you choose.  I will be in touch with your lab results

## 2021-01-12 NOTE — Progress Notes (Signed)
   Subjective:    Patient ID: Kelly Jordan, female    DOB: 1946/08/11, 75 y.o.   MRN: [id number]  HPI Chief Complaint  Patient presents with  . other    New pt. Est. No other issues going on   She is new to the practice and here to get established.  Previous medical care: [facility]   Other providers: Dr. [name] Plan- GI  [facility]    Diabetes- diagnosed in the past 8-10 years.  Last Hgb A1c 9.1% in early December 2021.  States she checks her blood sugars "sometimes".  2 days ago her FBS was 150  States she is taking glipizide, Metformin  Recently started on Rybelsus.  Stopped Januvia and started on Rybelsus.   States she went to a nutritionist in the past.   Last diabetic eye exam: 2021. Dr. Bari Mantis  Denies retinopathy  Lipid panel from 10/2020  LDL 55, trig 91, HDL 45 Taking Crestor 10 mg daily   HTN - good compliance with telmisartan,   Lost 30+ lbs on Optavia for 2 months. 6 months ago   Exercise class 2 times per week.    Social history: Lives alone, daughter in [place] she had Covid in 11/2020.  She also had all 3 Covid vaccines.   Reports being UTD with mammogram (06/2020), DEXA 12/2019 at [facility] Colonoscopy 04/2019 due for 3 yr recall    Denies fever, chills, dizziness, chest pain, palpitations, shortness of breath, abdominal pain, N/V/D, urinary symptoms, LE edema.    Reviewed allergies, medications, past medical, surgical, family, and social history.     Review of Systems Pertinent positives and negatives in the history of present illness.     Objective:   Physical Exam BP 120/80   Pulse 86   Temp 97.9 F (36.6 C)   Ht 5\' 4"  (1.626 m)   Wt 187 lb 12.8 oz (85.2 kg)   BMI 32.24 kg/m   Alert and in no distress. Neck is supple without adenopathy or thyromegaly. Cardiac exam shows a regular sinus rhythm without murmurs or gallops. Lungs are clear to auscultation. Extremities without edema. Skin  is warm and dry.       Assessment & Plan:  Diabetes mellitus without complication ([place]) - Plan: CBC with Differential/Platelet, Comprehensive metabolic panel, TSH, T4, free, Hemoglobin A1c -last Hgb A1c 9.1% and Januvia stopped, Rybelsus started per patient. Done by previous PCP. Has never seen an endocrinologist. Recommend she continue current medications and keep a closer eye on her BS at home.  Counseling on low carb and low sugar diet and increasing physical activity.  Follow up pending lab results. Consider referral to endocrinologist   Primary hypertension - Plan: CBC with Differential/Platelet, Comprehensive metabolic panel -BP controlled. Continue medication and low sodium diet   Mixed hyperlipidemia due to type 2 diabetes mellitus ([place]) -continue statin. Reviewed labs from previous PCP office and her lipids were controlled.   Obesity (BMI 30-39.9) - Plan: TSH, T4, free -counseling on weight loss but not extreme dieting such as Optavia as she has done in the past

## 2021-01-13 ENCOUNTER — Encounter: Payer: Self-pay | Admitting: Family Medicine

## 2021-01-13 LAB — COMPREHENSIVE METABOLIC PANEL
ALT: 21 IU/L (ref 0–32)
AST: 19 IU/L (ref 0–40)
Albumin/Globulin Ratio: 2.3 — ABNORMAL HIGH (ref 1.2–2.2)
Albumin: 4.6 g/dL (ref 3.7–4.7)
Alkaline Phosphatase: 78 IU/L (ref 44–121)
BUN/Creatinine Ratio: 15 (ref 12–28)
BUN: 11 mg/dL (ref 8–27)
Bilirubin Total: 0.5 mg/dL (ref 0.0–1.2)
CO2: 24 mmol/L (ref 20–29)
Calcium: 10.1 mg/dL (ref 8.7–10.3)
Chloride: 106 mmol/L (ref 96–106)
Creatinine, Ser: 0.73 mg/dL (ref 0.57–1.00)
Globulin, Total: 2 g/dL (ref 1.5–4.5)
Glucose: 165 mg/dL — ABNORMAL HIGH (ref 65–99)
Potassium: 5.1 mmol/L (ref 3.5–5.2)
Sodium: 144 mmol/L (ref 134–144)
Total Protein: 6.6 g/dL (ref 6.0–8.5)
eGFR: 85 mL/min/{1.73_m2} (ref >59)

## 2021-01-13 LAB — CBC WITH DIFFERENTIAL/PLATELET
Basophils Absolute: 0 10*3/uL (ref 0.0–0.2)
Basos: 0 %
EOS (ABSOLUTE): 0.2 10*3/uL (ref 0.0–0.4)
Eos: 2 %
Hematocrit: 40.9 % (ref 34.0–46.6)
Hemoglobin: 13.5 g/dL (ref 11.1–15.9)
Immature Grans (Abs): 0 10*3/uL (ref 0.0–0.1)
Immature Granulocytes: 0 %
Lymphocytes Absolute: 1.8 10*3/uL (ref 0.7–3.1)
Lymphs: 25 %
MCH: 29.5 pg (ref 26.6–33.0)
MCHC: 33 g/dL (ref 31.5–35.7)
MCV: 89 fL (ref 79–97)
Monocytes Absolute: 0.5 10*3/uL (ref 0.1–0.9)
Monocytes: 7 %
Neutrophils Absolute: 4.7 10*3/uL (ref 1.4–7.0)
Neutrophils: 66 %
Platelets: 239 10*3/uL (ref 150–450)
RBC: 4.58 x10E6/uL (ref 3.77–5.28)
RDW: 12.7 % (ref 11.7–15.4)
WBC: 7.2 10*3/uL (ref 3.4–10.8)

## 2021-01-13 LAB — TSH: TSH: 1.61 u[IU]/mL (ref 0.450–4.500)

## 2021-01-13 LAB — T4, FREE: Free T4: 1.36 ng/dL (ref 0.82–1.77)

## 2021-01-13 LAB — HEMOGLOBIN A1C
Est. average glucose Bld gHb Est-mCnc: 203 mg/dL
Hgb A1c MFr Bld: 8.7 % — ABNORMAL HIGH (ref 4.8–5.6)

## 2021-01-14 ENCOUNTER — Other Ambulatory Visit: Payer: Self-pay

## 2021-01-14 ENCOUNTER — Encounter: Payer: Self-pay | Admitting: Family Medicine

## 2021-01-14 DIAGNOSIS — E119 Type 2 diabetes mellitus without complications: Secondary | ICD-10-CM

## 2021-02-23 ENCOUNTER — Other Ambulatory Visit: Payer: Self-pay | Admitting: Internal Medicine

## 2021-02-23 ENCOUNTER — Encounter: Payer: Self-pay | Admitting: Internal Medicine

## 2021-02-23 ENCOUNTER — Encounter: Payer: Self-pay | Admitting: Family Medicine

## 2021-02-23 ENCOUNTER — Telehealth: Payer: Self-pay | Admitting: Family Medicine

## 2021-02-23 MED ORDER — GLIPIZIDE 5 MG PO TABS: 5.0000 mg | ORAL_TABLET | Freq: Every day | ORAL | 5 refills | Status: DC

## 2021-02-23 MED ORDER — GLIPIZIDE ER 10 MG PO TB24: 10.0000 mg | ORAL_TABLET | Freq: Every day | ORAL | 5 refills | Status: DC

## 2021-02-23 MED ORDER — GLIPIZIDE ER 5 MG PO TB24: 5.0000 mg | ORAL_TABLET | Freq: Every day | ORAL | 5 refills | Status: DC

## 2021-02-23 MED ORDER — TELMISARTAN 20 MG PO TABS: 20.0000 mg | ORAL_TABLET | Freq: Every day | ORAL | 5 refills | Status: DC

## 2021-02-23 NOTE — Telephone Encounter (Signed)
Pt called and is requesting a refill on her glipizide 5 mg Glipizide 10 xl  And telmisartan 20 mg Please send to the  [id number] - [place], [place] - [place]

## 2021-02-23 NOTE — Telephone Encounter (Signed)
Sent in med

## 2021-02-25 DIAGNOSIS — Z23 Encounter for immunization: Secondary | ICD-10-CM | POA: Diagnosis not present

## 2021-02-26 ENCOUNTER — Encounter: Payer: Self-pay | Admitting: Endocrinology

## 2021-02-26 ENCOUNTER — Ambulatory Visit: Payer: Medicare PPO | Admitting: Endocrinology

## 2021-02-26 ENCOUNTER — Other Ambulatory Visit: Payer: Self-pay

## 2021-02-26 VITALS — BP 150/82 | HR 98 | Ht 64.0 in | Wt 181.4 lb

## 2021-02-26 DIAGNOSIS — E119 Type 2 diabetes mellitus without complications: Secondary | ICD-10-CM | POA: Diagnosis not present

## 2021-02-26 LAB — POCT GLYCOSYLATED HEMOGLOBIN (HGB A1C): Hemoglobin A1C: 8 % — AB (ref 4.0–5.6)

## 2021-02-26 MED ORDER — METFORMIN HCL ER 500 MG PO TB24: 2000.0000 mg | ORAL_TABLET | Freq: Every day | ORAL | 3 refills | Status: DC

## 2021-02-26 MED ORDER — DAPAGLIFLOZIN PROPANEDIOL 5 MG PO TABS: 5.0000 mg | ORAL_TABLET | Freq: Every day | ORAL | 3 refills | Status: DC

## 2021-02-26 NOTE — Patient Instructions (Addendum)
good diet and exercise significantly improve the control of your diabetes.  please let me know if you wish to be referred to a dietician.  high blood sugar is very risky to your health.  you should see an eye doctor and dentist every year.  It is very important to get all recommended vaccinations.  Controlling your blood pressure and cholesterol drastically reduces the damage diabetes does to your body.  Those who smoke should quit.  Please discuss these with your doctor.  check your blood sugar once a day.  vary the time of day when you check, between before the 3 meals, and at bedtime.  also check if you have symptoms of your blood sugar being too high or too low.  please keep a record of the readings and bring it to your next appointment here (or you can bring the meter itself).  You can write it on any piece of paper.  please call us sooner if your blood sugar goes below 70, or if most of your readings are over 200. We will need to take this complex situation in stages. For now, please reduce the glipizide to 10 mg daily, and:  I have sent 2 prescriptions to your pharmacy: to start [place], and to change the metformin to extended-release.  Please come back for a follow-up appointment in 3 months.

## 2021-02-26 NOTE — Progress Notes (Signed)
Subjective:    Patient ID: Kelly Jordan, female    DOB: 1945-12-28, 75 y.o.   MRN: [id number]  HPI pt is referred by Harland Dingwall, NP, for diabetes.  Pt states DM was dx'ed in 2010; she is unaware of any chronic complications; she has never been on insulin; pt says her diet and exercise are good; she has never had GDM, pancreatitis, pancreatic surgery, severe hypoglycemia or DKA.  She takes 3 oral meds.  She says cbg varies from 120-160. She stopped Jardiance (did not feel well).  She says Rybelsus causes heartburn.   Past Medical History:  Diagnosis Date  . Cataract   . Diabetes mellitus without complication ([place])   . Hyperlipidemia   . Hypertension     Past Surgical History:  Procedure Laterality Date  . Blefoplasty  Bilateral   . BUNIONECTOMY Bilateral   . CATARACT EXTRACTION Bilateral 2019  . COLONOSCOPY  02/07/2014  . POLYPECTOMY    . ROTATOR CUFF REPAIR Right   . TUBAL LIGATION      Social History   Socioeconomic History  . Marital status: Widowed    Spouse name: Not on file  . Number of children: Not on file  . Years of education: Not on file  . Highest education level: Not on file  Occupational History  . Not on file  Tobacco Use  . Smoking status: Former Research scientist (life sciences)  . Smokeless tobacco: Never Used  . Tobacco comment: Quit about 14 years ago.   Vaping Use  . Vaping Use: Never used  Substance and Sexual Activity  . Alcohol use: Yes    Comment: Occasional wine  . Drug use: Never  . Sexual activity: Not on file  Other Topics Concern  . Not on file  Social History Narrative  . Not on file   Social Determinants of Health   Financial Resource Strain: Not on file  Food Insecurity: Not on file  Transportation Needs: Not on file  Physical Activity: Not on file  Stress: Not on file  Social Connections: Not on file  Intimate Partner Violence: Not on file    Current Outpatient Medications on File Prior to Visit  Medication Sig Dispense Refill  .  aspirin EC 81 MG tablet Take 81 mg by mouth daily.    . calcium-vitamin D (OSCAL WITH D) 500-200 MG-UNIT tablet Take 1 tablet by mouth.    Mar[place] Kitchen glipiZIDE (GLUCOTROL XL) 10 MG 24 hr tablet Take 1 tablet (10 mg total) by mouth daily with breakfast. 30 tablet 5  . glucose blood (ACCU-CHEK AVIVA PLUS) test strip USE 1 STRIP DAILY    . rosuvastatin (CRESTOR) 10 MG tablet Take 10 mg by mouth daily.    . Semaglutide (RYBELSUS) 7 MG TABS Take by mouth 1 day or 1 dose.    . telmisartan (MICARDIS) 20 MG tablet Take 1 tablet (20 mg total) by mouth daily. 30 tablet 5   No current facility-administered medications on file prior to visit.    No Known Allergies  Family History  Problem Relation Age of Onset  . Diabetes Sister   . Colon cancer Neg Hx   . Esophageal cancer Neg Hx   . Rectal cancer Neg Hx   . Stomach cancer Neg Hx   . Colon polyps Neg Hx     BP (!) 150/82 (BP Location: Right Arm, Patient Position: Sitting, Cuff Size: Large)   Pulse 98   Ht 5\' 4"  (1.626 m)   Wt 181 lb 6.4  oz (82.3 kg)   SpO2 95%   BMI 31.14 kg/m     Review of Systems denies weight loss, blurry vision, sob, n/v, memory loss, and depression.       Objective:   Physical Exam VITAL SIGNS:  See vs page GENERAL: no distress Pulses: dorsalis pedis intact bilat.   MSK: no deformity of the feet CV: no leg edema Skin:  no ulcer on the feet.  normal color and temp on the feet.  Neuro: sensation is intact to touch on the feet.     Lab Results  Component Value Date   CREATININE 0.73 01/12/2021   BUN 11 01/12/2021   NA 144 01/12/2021   K 5.1 01/12/2021   CL 106 01/12/2021   CO2 24 01/12/2021   Lab Results  Component Value Date   HGBA1C 8.0 (A) 02/26/2021   I have reviewed outside records, and summarized: Pt was noted to have elevated A1c, and referred here.  HTN and dyslipidemia were also addressed.      Assessment & Plan:  Type 2 DM: uncontrolled.  We discussed.  Goals are A1c < 7, and off glipizide.    Patient Instructions  good diet and exercise significantly improve the control of your diabetes.  please let me know if you wish to be referred to a dietician.  high blood sugar is very risky to your health.  you should see an eye doctor and dentist every year.  It is very important to get all recommended vaccinations.  Controlling your blood pressure and cholesterol drastically reduces the damage diabetes does to your body.  Those who smoke should quit.  Please discuss these with your doctor.  check your blood sugar once a day.  vary the time of day when you check, between before the 3 meals, and at bedtime.  also check if you have symptoms of your blood sugar being too high or too low.  please keep a record of the readings and bring it to your next appointment here (or you can bring the meter itself).  You can write it on any piece of paper.  please call us sooner if your blood sugar goes below 70, or if most of your readings are over 200. We will need to take this complex situation in stages. For now, please reduce the glipizide to 10 mg daily, and:  I have sent 2 prescriptions to your pharmacy: to start [place], and to change the metformin to extended-release.  Please come back for a follow-up appointment in 3 months.

## 2021-03-13 ENCOUNTER — Encounter: Payer: Self-pay | Admitting: Internal Medicine

## 2021-03-23 ENCOUNTER — Telehealth: Payer: Self-pay | Admitting: Family Medicine

## 2021-03-23 ENCOUNTER — Telehealth: Payer: Self-pay | Admitting: Endocrinology

## 2021-03-23 NOTE — Telephone Encounter (Signed)
Pt was advised to contact Dr. Loanne Drilling office

## 2021-03-23 NOTE — Telephone Encounter (Signed)
Pt called for refills of Rybelsus. We have never filled that for her before. It is on her list of meds. Pt is in [place]. Please [facility] to [facility] [address] in [place]. Pharmacy phone number is [phone]. Pt can be reached at [phone].

## 2021-03-23 NOTE — Telephone Encounter (Signed)
Patient called re: Patient requests a new RX for the following:  MEDICATION: Semaglutide (RYBELSUS) 7 MG TABS  PHARMACY:  [facility] [address], [place], [place]-Ph# [phone]  (due to Patient is in [place] for the next 2 weeks).   HAS THE PATIENT CONTACTED THEIR PHARMACY?  Yes-Patient used to have the above medication prescribed by a different Physician, however, Patient is now being treated by Dr. Loanne Drilling  IS THIS A 90 DAY SUPPLY : No  IS PATIENT OUT OF MEDICATION: Yes  IF NOT; HOW MUCH IS LEFT: 0  LAST APPOINTMENT DATE: @4 /14/2022  NEXT APPOINTMENT DATE:@7 /14/2022  DO WE HAVE YOUR PERMISSION TO LEAVE A DETAILED MESSAGE?: Yes  OTHER COMMENTS:    **Let patient know to contact pharmacy at the end of the day to make sure medication is ready. **  ** Please notify patient to allow 48-72 hours to process**  **Encourage patient to contact the pharmacy for refills or they can request refills through [facility]**

## 2021-03-24 ENCOUNTER — Telehealth: Payer: Self-pay

## 2021-03-24 NOTE — Telephone Encounter (Signed)
Patient called to check status on this refill.

## 2021-03-24 NOTE — Telephone Encounter (Signed)
Pt requesting Rx for Rybelsus 7mg . I do not see where you have prescribed this medication for this pt.

## 2021-03-25 ENCOUNTER — Other Ambulatory Visit: Payer: Self-pay | Admitting: *Deleted

## 2021-03-25 MED ORDER — RYBELSUS 7 MG PO TABS: ORAL_TABLET | ORAL | 1 refills | Status: DC

## 2021-03-25 NOTE — Telephone Encounter (Signed)
Rx sent 

## 2021-03-25 NOTE — Telephone Encounter (Signed)
Please refill prn per med list

## 2021-04-15 NOTE — Progress Notes (Signed)
Kelly Jordan is a 75 y.o. female who presents for annual wellness visit, CPE and follow-up on chronic medical conditions.  She has the following concerns:  DM managed by Dr. Loanne Jordan  States she is taking 4 medications now. BS improving.   HTN- taking telmisartan daily and no issues Does not check BP at home   Statin daily and no issues   osteopenia- per DEXA in 2016   Immunization History  Administered Date(s) Administered  . Hepatitis B, adult 08/01/2002, 09/10/2002, 02/11/2003  . Influenza, Quadrivalent, Recombinant, Inj, Pf 08/01/2017, 07/26/2018, 08/22/2019, 07/25/2020  . Influenza-Unspecified 11/15/2012  . PFIZER(Purple Top)SARS-COV-2 Vaccination 12/10/2019, 12/31/2019, 08/11/2020, 02/25/2021  . Pneumococcal Conjugate-13 10/30/2013  . Pneumococcal Polysaccharide-23 04/16/2021  . Pneumococcal-Unspecified 02/20/2010, 11/15/2010, 12/07/2018  . Tdap 08/06/2009, 11/15/2010  . Zoster, Live 03/12/2010, 11/15/2010, 10/27/2015   Last Pap smear: 2014 Last mammogram:  8/21 at [facility] colonoscopy: 2020 and follow up in 3 years  Last DEXA: 2016 on file. 2020 per patient  Dentist: every 6 months. Kelly Jordan Ophtho:Kelly Jordan and has appt next week  Exercise: 3 days a week, classes at [facility] [employer] [employer]   Other doctors caring for patient include: Dr. Loanne Jordan- DM  Lives with her son. Dr. 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   States she had her 4th booster Pfizer 02/25/2021    Depression screen:  See questionnaire below.  Depression screen Kelly Jordan 2/9 04/16/2021 01/12/2021  Decreased Interest 0 0  Down, Depressed, Hopeless 0 0  PHQ - 2 Score 0 0    Fall Risk Screen: see questionnaire below. Fall Risk  04/16/2021 01/12/2021  Falls in the past year? 0 0  Number falls in past yr: 0 0  Injury with Fall? 0 0  Risk for fall due to : No Fall Risks No Fall Risks  Follow up Falls evaluation completed Falls  evaluation completed    ADL screen:  See questionnaire below Functional Status Survey: Is the patient deaf or have difficulty hearing?: No Does the patient have difficulty seeing, even when wearing glasses/contacts?: No Does the patient have difficulty concentrating, remembering, or making decisions?: No Does the patient have difficulty walking or climbing stairs?: No Does the patient have difficulty dressing or bathing?: No Does the patient have difficulty doing errands alone such as visiting a doctor's office or shopping?: No   End of Life Discussion:  Patient has a living will and medical power of attorney. MOST form reviewed and discussed   Review of Systems Constitutional: -fever, -chills, -sweats, -unexpected weight change, -anorexia, -fatigue Allergy: -sneezing, -itching, -congestion Dermatology: denies changing moles, rash, lumps, new worrisome lesions ENT: -runny nose, -ear pain, -sore throat, -hoarseness, -sinus pain, -teeth pain, -tinnitus, -hearing loss, -epistaxis Cardiology:  -chest pain, -palpitations, -edema, -orthopnea, -paroxysmal nocturnal dyspnea Respiratory: -cough, -shortness of breath, -dyspnea on exertion, -wheezing, -hemoptysis Gastroenterology: -abdominal pain, -nausea, -vomiting, -diarrhea, -constipation, -blood in stool, -changes in bowel movement, -dysphagia Hematology: -bleeding or bruising problems Musculoskeletal: -arthralgias, -myalgias, -joint swelling, -back pain, -neck pain, -cramping, -gait changes Ophthalmology: -vision changes, -eye redness, -itching, -discharge Urology: -dysuria, -difficulty urinating, -hematuria, -urinary frequency, -urgency, +incontinence Neurology: -headache, -weakness, -tingling, -numbness, -speech abnormality, -memory loss, -falls, -dizziness Psychology:  -depressed mood, -agitation, -sleep problems    PHYSICAL EXAM:  BP 130/80   Pulse 88   Ht 5' 4.75" (1.645 m)   Wt 185 lb 6.4 oz (84.1 kg)   SpO2 96%   BMI 31.09  kg/m   General Appearance: Alert, cooperative, no distress, appears stated age Head: Normocephalic, without obvious abnormality, atraumatic Eyes: PERRL, conjunctiva/corneas clear, EOM's intact Ears: Normal TM's and external ear canals Nose: mask on  Throat: mask on  Neck: Supple, no lymphadenopathy; thyroid: no enlargement/tenderness/nodules; no JVD Back: Spine nontender, no curvature, ROM normal, no CVA tenderness Lungs: Clear to auscultation bilaterally without wheezes, rales or ronchi; respirations unlabored Chest Wall: No tenderness or deformity Heart: Regular rate and rhythm, S1 and S2 normal, no murmur, rub or gallop Breast Exam: No tenderness, masses, or nipple discharge or inversion. No axillary lymphadenopathy Abdomen: Soft, non-tender, nondistended, normoactive bowel sounds, no masses, no hepatosplenomegaly Genitalia: Normal external genitalia without lesions.  BUS and vagina normal; cervix without lesions, or cervical motion tenderness. No abnormal vaginal discharge.  Uterus and adnexa not enlarged, nontender, no masses.   Extremities: No clubbing, cyanosis or edema Pulses: 2+ and symmetric all extremities Skin: Skin color, texture, turgor normal, no rashes or lesions Lymph nodes: Cervical, supraclavicular, and axillary nodes normal Neurologic: CNII-XII intact, normal strength, sensation and gait Psych: Normal mood, affect, hygiene and grooming.  ASSESSMENT/PLAN: Medicare annual wellness visit, subsequent -Denies issues with ADLs, mood, memory or falls.  Advanced directive counseling done  Routine general medical examination at a health care facility - Plan: CBC with Differential/Platelet, Comprehensive metabolic panel -Preventive health care reviewed. counseled on healthy lifestyle including diet and exercise.  Immunizations reviewed.  Discussed safety and health promotion.  Diabetes mellitus without complication ([place]) - Plan: CBC with Differential/Platelet, Comprehensive  metabolic panel, TSH, T4, free -Under the care of Dr. Loanne Jordan  Primary hypertension - Plan: CBC with Differential/Platelet, Comprehensive metabolic panel -Blood pressure controlled.  Continue current medication and low-sodium diet  Mixed hyperlipidemia due to type 2 diabetes mellitus ([place]) - Plan: Lipid panel -Continue statin therapy and low-fat diet.  Follow-up pending lipid panel results  Obesity (BMI 30-39.9) - Plan: CBC with Differential/Platelet, Comprehensive metabolic panel, TSH, T4, free -Recommend healthy diet and exercise for weight loss  Need for hepatitis C screening test - Plan: Hepatitis C antibody -Done per screening guidelines  Osteopenia, unspecified location - Plan: VITAMIN D 25 Hydroxy (Vit-D Deficiency, Fractures) -Recommend getting adequate calcium in her diet, taking a vitamin D supplement and weightbearing exercises. I will request her most recent DEXA  Encounter for breast cancer screening using non-mammogram modality -She will get mammogram when due  Need for vaccination against Streptococcus pneumoniae - Plan: Pneumococcal polysaccharide vaccine 23-valent greater than or equal to 2yo subcutaneous/IM -Counseling on the vaccine and potential side effects  Stress incontinence -Counseling on Kegel exercises.  Empty bladder when the urge presents.  Follow-up if worsening  Immunization counseling  Advance directive discussed with patient  Estrogen deficiency - Plan: VITAMIN D 25 Hydroxy (Vit-D Deficiency, Fractures)  Medication management - Plan: VITAMIN D 25 Hydroxy (Vit-D Deficiency, Fractures), Lipid panel -Adjust vitamin D and statin therapy as appropriate     Discussed monthly self breast exams and yearly mammograms; at least 30 minutes of aerobic activity at least 5 days/week and weight-bearing exercise 2x/week; proper sunscreen use reviewed; healthy diet, including goals of calcium and vitamin D intake and alcohol recommendations (less than or equal  to 1 drink/day) reviewed; regular seatbelt use; changing batteries in smoke detectors.  Immunization recommendations discussed.  Colonoscopy recommendations reviewed   Medicare Attestation I have personally reviewed: The patient's medical and social history Their use of alcohol, tobacco or illicit drugs Their current medications and supplements The patient's functional ability  including ADLs,fall risks, home safety risks, cognitive, and hearing and visual impairment Diet and physical activities Evidence for depression or mood disorders  The patient's weight, height, and BMI have been recorded in the chart.  I have made referrals, counseling, and provided education to the patient based on review of the above and I have provided the patient with a written personalized care plan for preventive services.     Harland Dingwall, NP-C   04/16/2021

## 2021-04-16 ENCOUNTER — Other Ambulatory Visit: Payer: Self-pay

## 2021-04-16 ENCOUNTER — Encounter: Payer: Self-pay | Admitting: Family Medicine

## 2021-04-16 ENCOUNTER — Ambulatory Visit: Payer: Medicare PPO | Admitting: Family Medicine

## 2021-04-16 VITALS — BP 130/80 | HR 88 | Ht 64.75 in | Wt 185.4 lb

## 2021-04-16 DIAGNOSIS — E119 Type 2 diabetes mellitus without complications: Secondary | ICD-10-CM | POA: Diagnosis not present

## 2021-04-16 DIAGNOSIS — Z7189 Other specified counseling: Secondary | ICD-10-CM

## 2021-04-16 DIAGNOSIS — Z7185 Encounter for immunization safety counseling: Secondary | ICD-10-CM | POA: Diagnosis not present

## 2021-04-16 DIAGNOSIS — N393 Stress incontinence (female) (male): Secondary | ICD-10-CM

## 2021-04-16 DIAGNOSIS — E1169 Type 2 diabetes mellitus with other specified complication: Secondary | ICD-10-CM | POA: Diagnosis not present

## 2021-04-16 DIAGNOSIS — M8580 Other specified disorders of bone density and structure, unspecified site: Secondary | ICD-10-CM

## 2021-04-16 DIAGNOSIS — I10 Essential (primary) hypertension: Secondary | ICD-10-CM

## 2021-04-16 DIAGNOSIS — Z0000 Encounter for general adult medical examination without abnormal findings: Secondary | ICD-10-CM | POA: Diagnosis not present

## 2021-04-16 DIAGNOSIS — E2839 Other primary ovarian failure: Secondary | ICD-10-CM

## 2021-04-16 DIAGNOSIS — Z23 Encounter for immunization: Secondary | ICD-10-CM

## 2021-04-16 DIAGNOSIS — E782 Mixed hyperlipidemia: Secondary | ICD-10-CM

## 2021-04-16 DIAGNOSIS — E669 Obesity, unspecified: Secondary | ICD-10-CM | POA: Diagnosis not present

## 2021-04-16 DIAGNOSIS — Z79899 Other long term (current) drug therapy: Secondary | ICD-10-CM | POA: Diagnosis not present

## 2021-04-16 DIAGNOSIS — Z1239 Encounter for other screening for malignant neoplasm of breast: Secondary | ICD-10-CM | POA: Diagnosis not present

## 2021-04-16 DIAGNOSIS — Z1159 Encounter for screening for other viral diseases: Secondary | ICD-10-CM

## 2021-04-16 NOTE — Patient Instructions (Addendum)
Kelly Jordan , Thank you for taking time to come for your Medicare Wellness Visit. I appreciate your ongoing commitment to your health goals. Please review the following plan we discussed and let me know if I can assist you in the future.   These are the goals we discussed:  Take a vitamin D supplement over the counter daily of 1,000 to 2,000 IUs.  Continue getting plenty of calcium in your diet (1,200 mg daily is the goal).   Do weight bearing exercises (walking, etc) at least 150 minutes per week.   You are due for Tdap and can get this at your pharmacy.   I also recommend checking with your pharmacy and insurance carrier regarding Shingrix (2 shot series).   We gave you pneumovax 23 (pneumonia) vaccine today. You will be due for Prevnar 13 next year.   Follow up with Dr. Loanne Drilling as recommended.   We will be in touch with your lab results.     This is a list of the screening recommended for you and due dates:  Health Maintenance  Topic Date Due  . Hepatitis C Screening: USPSTF Recommendation to screen - Ages 9-79 yo.  Never done  . Zoster (Shingles) Vaccine (1 of 2) Never done  . Tetanus Vaccine  11/15/2020  . Eye exam for diabetics  04/16/2021  . Flu Shot  06/15/2021  . Hemoglobin A1C  08/28/2021  . Complete foot exam   02/26/2022  . Colon Cancer Screening  05/07/2022  . DEXA scan (bone density measurement)  Completed  . COVID-19 Vaccine  Completed  . Pneumonia vaccines  Completed  . HPV Vaccine  Aged Out    Kegel Exercises  Kegel exercises can help strengthen your pelvic floor muscles. The pelvic floor is a group of muscles that support your rectum, small intestine, and bladder. In females, pelvic floor muscles also help support the womb (uterus). These muscles help you control the flow of urine and stool. Kegel exercises are painless and simple, and they do not require any equipment. Your provider may suggest Kegel exercises to:  Improve bladder and bowel  control.  Improve sexual response.  Improve weak pelvic floor muscles after surgery to remove the uterus (hysterectomy) or pregnancy (females).  Improve weak pelvic floor muscles after prostate gland removal or surgery (males). Kegel exercises involve squeezing your pelvic floor muscles, which are the same muscles you squeeze when you try to stop the flow of urine or keep from passing gas. The exercises can be done while sitting, standing, or lying down, but it is best to vary your position. Exercises How to do Kegel exercises: 1. Squeeze your pelvic floor muscles tight. You should feel a tight lift in your rectal area. If you are a female, you should also feel a tightness in your vaginal area. Keep your stomach, buttocks, and legs relaxed. 2. Hold the muscles tight for up to 10 seconds. 3. Breathe normally. 4. Relax your muscles. 5. Repeat as told by your health care provider. Repeat this exercise daily as told by your health care provider. Continue to do this exercise for at least 4-6 weeks, or for as long as told by your health care provider. You may be referred to a physical therapist who can help you learn more about how to do Kegel exercises. Depending on your condition, your health care provider may recommend:  Varying how long you squeeze your muscles.  Doing several sets of exercises every day.  Doing exercises for several weeks.  Making Kegel exercises a part of your regular exercise routine. This information is not intended to replace advice given to you by your health care provider. Make sure you discuss any questions you have with your health care provider. Document Revised: 03/07/2020 Document Reviewed: 06/21/2018 Elsevier Patient Education  [place].

## 2021-04-17 LAB — COMPREHENSIVE METABOLIC PANEL
ALT: 30 IU/L (ref 0–32)
AST: 23 IU/L (ref 0–40)
Albumin/Globulin Ratio: 2.1 (ref 1.2–2.2)
Albumin: 4.7 g/dL (ref 3.7–4.7)
Alkaline Phosphatase: 94 IU/L (ref 44–121)
BUN/Creatinine Ratio: 20 (ref 12–28)
BUN: 17 mg/dL (ref 8–27)
Bilirubin Total: 0.4 mg/dL (ref 0.0–1.2)
CO2: 21 mmol/L (ref 20–29)
Calcium: 10 mg/dL (ref 8.7–10.3)
Chloride: 102 mmol/L (ref 96–106)
Creatinine, Ser: 0.87 mg/dL (ref 0.57–1.00)
Globulin, Total: 2.2 g/dL (ref 1.5–4.5)
Glucose: 171 mg/dL — ABNORMAL HIGH (ref 65–99)
Potassium: 4.7 mmol/L (ref 3.5–5.2)
Sodium: 141 mmol/L (ref 134–144)
Total Protein: 6.9 g/dL (ref 6.0–8.5)
eGFR: 69 mL/min/{1.73_m2} (ref >59)

## 2021-04-17 LAB — CBC WITH DIFFERENTIAL/PLATELET
Basophils Absolute: 0 10*3/uL (ref 0.0–0.2)
Basos: 1 %
EOS (ABSOLUTE): 0.2 10*3/uL (ref 0.0–0.4)
Eos: 2 %
Hematocrit: 43.1 % (ref 34.0–46.6)
Hemoglobin: 14 g/dL (ref 11.1–15.9)
Immature Grans (Abs): 0 10*3/uL (ref 0.0–0.1)
Immature Granulocytes: 0 %
Lymphocytes Absolute: 1.8 10*3/uL (ref 0.7–3.1)
Lymphs: 24 %
MCH: 29 pg (ref 26.6–33.0)
MCHC: 32.5 g/dL (ref 31.5–35.7)
MCV: 89 fL (ref 79–97)
Monocytes Absolute: 0.5 10*3/uL (ref 0.1–0.9)
Monocytes: 7 %
Neutrophils Absolute: 4.9 10*3/uL (ref 1.4–7.0)
Neutrophils: 66 %
Platelets: 234 10*3/uL (ref 150–450)
RBC: 4.83 x10E6/uL (ref 3.77–5.28)
RDW: 12.2 % (ref 11.7–15.4)
WBC: 7.5 10*3/uL (ref 3.4–10.8)

## 2021-04-17 LAB — VITAMIN D 25 HYDROXY (VIT D DEFICIENCY, FRACTURES): Vit D, 25-Hydroxy: 33.2 ng/mL (ref 30.0–100.0)

## 2021-04-17 LAB — T4, FREE: Free T4: 1.19 ng/dL (ref 0.82–1.77)

## 2021-04-17 LAB — LIPID PANEL
Chol/HDL Ratio: 2.7 ratio (ref 0.0–4.4)
Cholesterol, Total: 120 mg/dL (ref 100–199)
HDL: 45 mg/dL (ref >39)
LDL Chol Calc (NIH): 58 mg/dL (ref 0–99)
Triglycerides: 87 mg/dL (ref 0–149)
VLDL Cholesterol Cal: 17 mg/dL (ref 5–40)

## 2021-04-17 LAB — TSH: TSH: 2.63 u[IU]/mL (ref 0.450–4.500)

## 2021-04-17 LAB — HEPATITIS C ANTIBODY: Hep C Virus Ab: 0.1 s/co ratio (ref 0.0–0.9)

## 2021-04-21 ENCOUNTER — Encounter: Payer: Self-pay | Admitting: Internal Medicine

## 2021-04-21 DIAGNOSIS — Z961 Presence of intraocular lens: Secondary | ICD-10-CM | POA: Diagnosis not present

## 2021-04-21 DIAGNOSIS — H52203 Unspecified astigmatism, bilateral: Secondary | ICD-10-CM | POA: Diagnosis not present

## 2021-04-21 DIAGNOSIS — H524 Presbyopia: Secondary | ICD-10-CM | POA: Diagnosis not present

## 2021-04-21 DIAGNOSIS — E119 Type 2 diabetes mellitus without complications: Secondary | ICD-10-CM | POA: Diagnosis not present

## 2021-04-21 LAB — HM DIABETES EYE EXAM

## 2021-04-24 ENCOUNTER — Encounter: Payer: Self-pay | Admitting: Family Medicine

## 2021-05-28 ENCOUNTER — Ambulatory Visit: Payer: Medicare PPO | Admitting: Endocrinology

## 2021-05-28 ENCOUNTER — Other Ambulatory Visit: Payer: Self-pay

## 2021-05-28 VITALS — BP 134/80 | HR 80 | Ht 64.0 in | Wt 181.0 lb

## 2021-05-28 DIAGNOSIS — E119 Type 2 diabetes mellitus without complications: Secondary | ICD-10-CM | POA: Diagnosis not present

## 2021-05-28 LAB — POCT GLYCOSYLATED HEMOGLOBIN (HGB A1C): Hemoglobin A1C: 8.1 % — AB (ref 4.0–5.6)

## 2021-05-28 MED ORDER — EMPAGLIFLOZIN 25 MG PO TABS: 25.0000 mg | ORAL_TABLET | Freq: Every day | ORAL | 3 refills | Status: DC

## 2021-05-28 MED ORDER — RYBELSUS 14 MG PO TABS: 14.0000 mg | ORAL_TABLET | Freq: Every day | ORAL | 3 refills | Status: DC

## 2021-05-28 MED ORDER — GLIPIZIDE 5 MG PO TABS: 5.0000 mg | ORAL_TABLET | Freq: Every day | ORAL | 3 refills | Status: DC

## 2021-05-28 NOTE — Patient Instructions (Addendum)
check your blood sugar once a day.  vary the time of day when you check, between before the 3 meals, and at bedtime.  also check if you have symptoms of your blood sugar being too high or too low.  please keep a record of the readings and bring it to your next appointment here (or you can bring the meter itself).  You can write it on any piece of paper.  please call us sooner if your blood sugar goes below 70, or if most of your readings are over 200. We will need to take this complex situation in stages.   I have sent 3 prescriptions to your pharmacy, to change the Farxiga to [place], to reduce the glipizide, and to increase the Rybelsus.   Please come back for a follow-up appointment in 2-3 months.

## 2021-05-28 NOTE — Progress Notes (Signed)
Subjective:    Patient ID: Kelly Jordan, female    DOB: Feb 08, 1946, 75 y.o.   MRN: [id number]  HPI Pt returns for f/u of diabetes mellitus: DM type: 2 Dx'ed: 9470 Complications: none Therapy: 3 oral meds GDM: never DKA: never Severe hypoglycemia: never Pancreatitis: never Pancreatic imaging: none known SDOH: none Other: she has never been on insulin; Rybelsus causes heartburn; She stopped Jardiance (did not feel well) Interval history: Pt says cbg varies from 120-150.  Heartburn is less now.  She takes meds as rx'ed.  She wants to try changing Farxiga to [place], due to cost.   Past Medical History:  Diagnosis Date   Cataract    Diabetes mellitus without complication ([place])    Hyperlipidemia    Hypertension     Past Surgical History:  Procedure Laterality Date   Blefoplasty  Bilateral    BUNIONECTOMY Bilateral    CATARACT EXTRACTION Bilateral 2019   COLONOSCOPY  02/07/2014   POLYPECTOMY     ROTATOR CUFF REPAIR Right    TUBAL LIGATION      Social History   Socioeconomic History   Marital status: Widowed    Spouse name: Not on file   Number of children: Not on file   Years of education: Not on file   Highest education level: Not on file  Occupational History   Not on file  Tobacco Use   Smoking status: Former   Smokeless tobacco: Never   Tobacco comments:    Quit about 14 years ago.   Vaping Use   Vaping Use: Never used  Substance and Sexual Activity   Alcohol use: Yes    Comment: Occasional wine   Drug use: Never   Sexual activity: Not on file  Other Topics Concern   Not on file  Social History Narrative   Not on file   Social Determinants of Health   Financial Resource Strain: Not on file  Food Insecurity: Not on file  Transportation Needs: Not on file  Physical Activity: Not on file  Stress: Not on file  Social Connections: Not on file  Intimate Partner Violence: Not on file    Current Outpatient Medications on File Prior to Visit   Medication Sig Dispense Refill   aspirin EC 81 MG tablet Take 81 mg by mouth daily.     calcium-vitamin D (OSCAL WITH D) 500-200 MG-UNIT tablet Take 1 tablet by mouth.     glucose blood (ACCU-CHEK AVIVA PLUS) test strip USE 1 STRIP DAILY     metFORMIN (GLUCOPHAGE-XR) 500 MG 24 hr tablet Take 4 tablets (2,000 mg total) by mouth daily. 360 tablet 3   rosuvastatin (CRESTOR) 10 MG tablet Take 10 mg by mouth daily.     telmisartan (MICARDIS) 20 MG tablet Take 1 tablet (20 mg total) by mouth daily. 30 tablet 5   No current facility-administered medications on file prior to visit.    No Known Allergies  Family History  Problem Relation Age of Onset   Diabetes Sister    Colon cancer Neg Hx    Esophageal cancer Neg Hx    Rectal cancer Neg Hx    Stomach cancer Neg Hx    Colon polyps Neg Hx     BP 134/80   Pulse 80   Ht 5\' 4"  (1.626 m)   Wt 181 lb (82.1 kg)   SpO2 96%   BMI 31.07 kg/m    Review of Systems She denies hypoglycemia.      Objective:  Physical Exam Pulses: dorsalis pedis intact bilat.   MSK: no deformity of the feet CV: no leg edema Skin:  no ulcer on the feet.  normal color and temp on the feet. Neuro: sensation is intact to touch on the feet.    Lab Results  Component Value Date   CREATININE 0.87 04/16/2021   BUN 17 04/16/2021   NA 141 04/16/2021   K 4.7 04/16/2021   CL 102 04/16/2021   CO2 21 04/16/2021   Lab Results  Component Value Date   HGBA1C 8.1 (A) 05/28/2021      Assessment & Plan:  Type 2 DM: uncontrolled.   Patient Instructions  check your blood sugar once a day.  vary the time of day when you check, between before the 3 meals, and at bedtime.  also check if you have symptoms of your blood sugar being too high or too low.  please keep a record of the readings and bring it to your next appointment here (or you can bring the meter itself).  You can write it on any piece of paper.  please call us sooner if your blood sugar goes below 70, or  if most of your readings are over 200. We will need to take this complex situation in stages.   I have sent 3 prescriptions to your pharmacy, to change the Farxiga to [place], to reduce the glipizide, and to increase the Rybelsus.   Please come back for a follow-up appointment in 2-3 months.

## 2021-05-29 ENCOUNTER — Encounter: Payer: Self-pay | Admitting: Internal Medicine

## 2021-06-03 ENCOUNTER — Other Ambulatory Visit: Payer: Self-pay | Admitting: Family Medicine

## 2021-06-03 DIAGNOSIS — Z1231 Encounter for screening mammogram for malignant neoplasm of breast: Secondary | ICD-10-CM

## 2021-06-23 ENCOUNTER — Telehealth: Payer: Self-pay | Admitting: Family Medicine

## 2021-06-23 ENCOUNTER — Other Ambulatory Visit: Payer: Self-pay

## 2021-06-23 MED ORDER — ROSUVASTATIN CALCIUM 10 MG PO TABS: 10.0000 mg | ORAL_TABLET | Freq: Every day | ORAL | 0 refills | Status: DC

## 2021-06-23 NOTE — Telephone Encounter (Signed)
Done KH 

## 2021-06-23 NOTE — Telephone Encounter (Signed)
Pt needs a new prescription for Rosuvastatin sent to the [facility] on [address]. She is requesting a 90 day supply if possible

## 2021-07-15 ENCOUNTER — Telehealth: Payer: Self-pay | Admitting: Endocrinology

## 2021-07-15 ENCOUNTER — Other Ambulatory Visit: Payer: Self-pay | Admitting: Endocrinology

## 2021-07-15 MED ORDER — RYBELSUS 7 MG PO TABS: 7.0000 mg | ORAL_TABLET | Freq: Every day | ORAL | 3 refills | Status: DC

## 2021-07-15 NOTE — Telephone Encounter (Signed)
Patient requests to be called at ph# [phone] re: Patient states she is still having side effects from since Jardiance and Rybelsus that are getting worse. Patient states she is experiencing nausea and acidic stomach. Patient states she is feeling queezy.

## 2021-07-15 NOTE — Telephone Encounter (Signed)
I called patient and informed her that Rx was sent in.

## 2021-07-24 ENCOUNTER — Other Ambulatory Visit: Payer: Self-pay | Admitting: Family Medicine

## 2021-07-27 ENCOUNTER — Ambulatory Visit
Admission: RE | Admit: 2021-07-27 | Discharge: 2021-07-27 | Disposition: A | Discharge status: Home / Self Care | Payer: Medicare PPO | Source: Ambulatory Visit | Attending: Family Medicine | Admitting: Family Medicine

## 2021-07-27 ENCOUNTER — Other Ambulatory Visit: Payer: Self-pay

## 2021-07-27 DIAGNOSIS — Z1231 Encounter for screening mammogram for malignant neoplasm of breast: Secondary | ICD-10-CM | POA: Diagnosis not present

## 2021-07-28 ENCOUNTER — Telehealth: Payer: Self-pay

## 2021-07-28 NOTE — Telephone Encounter (Signed)
Newly prescribed RYBELSUS and Jardiance. Both or one of the medications is causing indigestion and upset stomach. Per pt please call son Gerald Stabs with advice [phone]

## 2021-07-31 ENCOUNTER — Other Ambulatory Visit: Payer: Self-pay | Admitting: Endocrinology

## 2021-07-31 MED ORDER — RYBELSUS 3 MG PO TABS: 3.0000 mg | ORAL_TABLET | Freq: Every day | ORAL | 3 refills | Status: DC

## 2021-07-31 NOTE — Telephone Encounter (Signed)
Patient called again re: Patient has not received a call back from message sent 07/28/21. Patient requests to be called at ph# [phone]. Patient states she is having pain in stomach, light headedness, nausea and acid reflux. Patient states that even though the dosage of Rybelsus was reduced, Patient is still experiencing the above listed side effects and would like to know if there is an alternative medication. Patient states she is leaving town 08/04/21 and requests to be called at the ph# listed above asap.

## 2021-09-10 ENCOUNTER — Ambulatory Visit: Payer: Medicare PPO | Admitting: Endocrinology

## 2021-09-10 ENCOUNTER — Other Ambulatory Visit: Payer: Self-pay

## 2021-09-10 VITALS — BP 100/58 | HR 76 | Ht 64.0 in | Wt 175.6 lb

## 2021-09-10 DIAGNOSIS — E119 Type 2 diabetes mellitus without complications: Secondary | ICD-10-CM

## 2021-09-10 LAB — POCT GLYCOSYLATED HEMOGLOBIN (HGB A1C): Hemoglobin A1C: 7.4 % — AB (ref 4.0–5.6)

## 2021-09-10 MED ORDER — REPAGLINIDE 1 MG PO TABS: 1.0000 mg | ORAL_TABLET | Freq: Two times a day (BID) | ORAL | 11 refills | Status: DC

## 2021-09-10 NOTE — Patient Instructions (Addendum)
check your blood sugar once a day.  vary the time of day when you check, between before the 3 meals, and at bedtime.  also check if you have symptoms of your blood sugar being too high or too low.  please keep a record of the readings and bring it to your next appointment here (or you can bring the meter itself).  You can write it on any piece of paper.  please call us sooner if your blood sugar goes below 70, or if most of your readings are over 200. I have sent a prescription to your pharmacy, to change glipizide to repaglinide. Please continue the same othermedications Please come back for a follow-up appointment in 3 months.

## 2021-09-10 NOTE — Progress Notes (Signed)
Subjective:    Patient ID: Kelly Jordan, female    DOB: [birth year], 75 y.o.   MRN: [id number]  HPI Pt returns for f/u of diabetes mellitus: DM type: 2 Dx'ed: 6720 Complications: none Therapy: 3 oral meds GDM: never DKA: never Severe hypoglycemia: never Pancreatitis: never Pancreatic imaging: none known SDOH: none Other: she has never been on insulin; Rybelsus dosage is limited by heartburn; She stopped Jardiance (did not feel well).   Interval history: Pt says cbg varies from 106-160.  Heartburn is less now on the reduced dosage of Rybelsus.  She takes meds as rx'ed.   Past Medical History:  Diagnosis Date   Cataract    Diabetes mellitus without complication ([place])    Hyperlipidemia    Hypertension     Past Surgical History:  Procedure Laterality Date   Blefoplasty  Bilateral    BUNIONECTOMY Bilateral    CATARACT EXTRACTION Bilateral 2019   COLONOSCOPY  02/07/2014   POLYPECTOMY     ROTATOR CUFF REPAIR Right    TUBAL LIGATION      Social History   Socioeconomic History   Marital status: Widowed    Spouse name: Not on file   Number of children: Not on file   Years of education: Not on file   Highest education level: Not on file  Occupational History   Not on file  Tobacco Use   Smoking status: Former   Smokeless tobacco: Never   Tobacco comments:    Quit about 14 years ago.   Vaping Use   Vaping Use: Never used  Substance and Sexual Activity   Alcohol use: Yes    Comment: Occasional wine   Drug use: Never   Sexual activity: Not on file  Other Topics Concern   Not on file  Social History Narrative   Not on file   Social Determinants of Health   Financial Resource Strain: Not on file  Food Insecurity: Not on file  Transportation Needs: Not on file  Physical Activity: Not on file  Stress: Not on file  Social Connections: Not on file  Intimate Partner Violence: Not on file    Current Outpatient Medications on File Prior to Visit   Medication Sig Dispense Refill   aspirin EC 81 MG tablet Take 81 mg by mouth daily.     calcium-vitamin D (OSCAL WITH D) 500-200 MG-UNIT tablet Take 1 tablet by mouth.     empagliflozin (JARDIANCE) 25 MG TABS tablet Take 1 tablet (25 mg total) by mouth daily before breakfast. 90 tablet 3   glucose blood (ACCU-CHEK AVIVA PLUS) test strip USE 1 STRIP DAILY     metFORMIN (GLUCOPHAGE-XR) 500 MG 24 hr tablet Take 4 tablets (2,000 mg total) by mouth daily. 360 tablet 3   rosuvastatin (CRESTOR) 10 MG tablet TAKE 1 TABLET BY MOUTH EVERY DAY 90 tablet 1   Semaglutide (RYBELSUS) 3 MG TABS Take 3 mg by mouth daily. 90 tablet 3   telmisartan (MICARDIS) 20 MG tablet Take 1 tablet (20 mg total) by mouth daily. 30 tablet 5   No current facility-administered medications on file prior to visit.    No Known Allergies  Family History  Problem Relation Age of Onset   Diabetes Sister    Colon cancer Neg Hx    Esophageal cancer Neg Hx    Rectal cancer Neg Hx    Stomach cancer Neg Hx    Colon polyps Neg Hx     BP (!) 100/58 (BP Location:  Right Arm, Patient Position: Sitting, Cuff Size: Large)   Pulse 76   Ht 5\' 4"  (1.626 m)   Wt 175 lb 9.6 oz (79.7 kg)   SpO2 96%   BMI 30.14 kg/m    Review of Systems     Objective:   Physical Exam  Lab Results  Component Value Date   CREATININE 0.87 04/16/2021   BUN 17 04/16/2021   NA 141 04/16/2021   K 4.7 04/16/2021   CL 102 04/16/2021   CO2 21 04/16/2021    Lab Results  Component Value Date   HGBA1C 7.4 (A) 09/10/2021      Assessment & Plan:  HB, due to repaglinide.  We discussed, and decided to continue the same.  Type 2 [id number].   Patient Instructions  check your blood sugar once a day.  vary the time of day when you check, between before the 3 meals, and at bedtime.  also check if you have symptoms of your blood sugar being too high or too low.  please keep a record of the readings and bring it to your next appointment here (or  you can bring the meter itself).  You can write it on any piece of paper.  please call us sooner if your blood sugar goes below 70, or if most of your readings are over 200. I have sent a prescription to your pharmacy, to change glipizide to repaglinide. Please continue the same othermedications Please come back for a follow-up appointment in 3 months.

## 2021-11-30 ENCOUNTER — Other Ambulatory Visit: Payer: Self-pay

## 2021-11-30 MED ORDER — TELMISARTAN 20 MG PO TABS: 20.0000 mg | ORAL_TABLET | Freq: Every day | ORAL | 1 refills | Status: DC

## 2021-12-17 ENCOUNTER — Ambulatory Visit: Payer: Medicare PPO | Admitting: Endocrinology

## 2021-12-17 ENCOUNTER — Other Ambulatory Visit: Payer: Self-pay

## 2021-12-17 VITALS — BP 140/82 | HR 78 | Ht 64.0 in | Wt 173.8 lb

## 2021-12-17 DIAGNOSIS — E119 Type 2 diabetes mellitus without complications: Secondary | ICD-10-CM | POA: Diagnosis not present

## 2021-12-17 LAB — POCT GLYCOSYLATED HEMOGLOBIN (HGB A1C): Hemoglobin A1C: 8.8 % — AB (ref 4.0–5.6)

## 2021-12-17 MED ORDER — GLIPIZIDE 5 MG PO TABS: 5.0000 mg | ORAL_TABLET | Freq: Every day | ORAL | 3 refills | Status: DC

## 2021-12-17 NOTE — Progress Notes (Signed)
Subjective:    Patient ID: Kelly Jordan, female    DOB: 07-21-1946, 76 y.o.   MRN: [id number]  HPI Pt returns for f/u of diabetes mellitus:   DM type: 2 Dx'ed: 4268 Complications: none Therapy: 3 oral meds.   GDM: never DKA: never Severe hypoglycemia: never Pancreatitis: never Pancreatic imaging: none known SDOH: none Other: she has never been on insulin; Rybelsus dosage is limited by heartburn. Interval history: Pt says cbg varies from 150-180.  She takes meds as rx'ed.  Pt says she often forgets the PM dose of repaglinide.  She has lost a few lbs.   Past Medical History:  Diagnosis Date   Cataract    Diabetes mellitus without complication ([place])    Hyperlipidemia    Hypertension     Past Surgical History:  Procedure Laterality Date   Blefoplasty  Bilateral    BUNIONECTOMY Bilateral    CATARACT EXTRACTION Bilateral 2019   COLONOSCOPY  02/07/2014   POLYPECTOMY     ROTATOR CUFF REPAIR Right    TUBAL LIGATION      Social History   Socioeconomic History   Marital status: Widowed    Spouse name: Not on file   Number of children: Not on file   Years of education: Not on file   Highest education level: Not on file  Occupational History   Not on file  Tobacco Use   Smoking status: Former   Smokeless tobacco: Never   Tobacco comments:    Quit about 14 years ago.   Vaping Use   Vaping Use: Never used  Substance and Sexual Activity   Alcohol use: Yes    Comment: Occasional wine   Drug use: Never   Sexual activity: Not on file  Other Topics Concern   Not on file  Social History Narrative   Not on file   Social Determinants of Health   Financial Resource Strain: Not on file  Food Insecurity: Not on file  Transportation Needs: Not on file  Physical Activity: Not on file  Stress: Not on file  Social Connections: Not on file  Intimate Partner Violence: Not on file    Current Outpatient Medications on File Prior to Visit  Medication Sig Dispense  Refill   aspirin EC 81 MG tablet Take 81 mg by mouth daily.     calcium-vitamin D (OSCAL WITH D) 500-200 MG-UNIT tablet Take 1 tablet by mouth.     empagliflozin (JARDIANCE) 25 MG TABS tablet Take 1 tablet (25 mg total) by mouth daily before breakfast. 90 tablet 3   glucose blood (ACCU-CHEK AVIVA PLUS) test strip USE 1 STRIP DAILY     metFORMIN (GLUCOPHAGE-XR) 500 MG 24 hr tablet Take 4 tablets (2,000 mg total) by mouth daily. 360 tablet 3   rosuvastatin (CRESTOR) 10 MG tablet TAKE 1 TABLET BY MOUTH EVERY DAY 90 tablet 1   Semaglutide (RYBELSUS) 3 MG TABS Take 3 mg by mouth daily. 90 tablet 3   telmisartan (MICARDIS) 20 MG tablet Take 1 tablet (20 mg total) by mouth daily. 90 tablet 1   No current facility-administered medications on file prior to visit.    No Known Allergies  Family History  Problem Relation Age of Onset   Diabetes Sister    Colon cancer Neg Hx    Esophageal cancer Neg Hx    Rectal cancer Neg Hx    Stomach cancer Neg Hx    Colon polyps Neg Hx     BP 140/82  Pulse 78    Ht 5\' 4"  (1.626 m)    Wt 173 lb 12.8 oz (78.8 kg)    SpO2 97%    BMI 29.83 kg/m    Review of Systems Denies N/V/HB/bloating.      Objective:   Physical Exam   Lab Results  Component Value Date   HGBA1C 8.8 (A) 12/17/2021      Assessment & Plan:  Type 2 DM: uncontrolled.    Patient Instructions  check your blood sugar once a day.  vary the time of day when you check, between before the 3 meals, and at bedtime.  also check if you have symptoms of your blood sugar being too high or too low.  please keep a record of the readings and bring it to your next appointment here (or you can bring the meter itself).  You can write it on any piece of paper.  please call us sooner if your blood sugar goes below 70, or if most of your readings are over 200.   I have sent a prescription to your pharmacy, to change repaglinide back to glipizide.   Please continue the same other 2 diabetes medications.    Please come back for a follow-up appointment in 2-3 months.

## 2021-12-17 NOTE — Patient Instructions (Addendum)
check your blood sugar once a day.  vary the time of day when you check, between before the 3 meals, and at bedtime.  also check if you have symptoms of your blood sugar being too high or too low.  please keep a record of the readings and bring it to your next appointment here (or you can bring the meter itself).  You can write it on any piece of paper.  please call us sooner if your blood sugar goes below 70, or if most of your readings are over 200.   I have sent a prescription to your pharmacy, to change repaglinide back to glipizide.   Please continue the same other 2 diabetes medications.   Please come back for a follow-up appointment in 2-3 months.

## 2021-12-29 ENCOUNTER — Other Ambulatory Visit: Payer: Self-pay | Admitting: Endocrinology

## 2022-01-01 ENCOUNTER — Other Ambulatory Visit: Payer: Self-pay | Admitting: Endocrinology

## 2022-01-11 ENCOUNTER — Other Ambulatory Visit: Payer: Self-pay

## 2022-01-11 DIAGNOSIS — E119 Type 2 diabetes mellitus without complications: Secondary | ICD-10-CM

## 2022-01-11 MED ORDER — ACCU-CHEK AVIVA PLUS VI STRP: ORAL_STRIP | 3 refills | Status: AC

## 2022-02-12 ENCOUNTER — Other Ambulatory Visit: Payer: Self-pay

## 2022-02-12 MED ORDER — ROSUVASTATIN CALCIUM 10 MG PO TABS: 10.0000 mg | ORAL_TABLET | Freq: Every day | ORAL | 0 refills | Status: DC

## 2022-02-26 ENCOUNTER — Other Ambulatory Visit: Payer: Self-pay | Admitting: Endocrinology

## 2022-03-01 ENCOUNTER — Encounter: Payer: Self-pay | Admitting: Physician Assistant

## 2022-03-01 ENCOUNTER — Telehealth: Payer: Medicare PPO | Admitting: Physician Assistant

## 2022-03-01 VITALS — Ht 65.0 in | Wt 173.0 lb

## 2022-03-01 DIAGNOSIS — B9689 Other specified bacterial agents as the cause of diseases classified elsewhere: Secondary | ICD-10-CM

## 2022-03-01 DIAGNOSIS — J301 Allergic rhinitis due to pollen: Secondary | ICD-10-CM

## 2022-03-01 DIAGNOSIS — J0140 Acute pansinusitis, unspecified: Secondary | ICD-10-CM | POA: Diagnosis not present

## 2022-03-01 DIAGNOSIS — H109 Unspecified conjunctivitis: Secondary | ICD-10-CM

## 2022-03-01 MED ORDER — DOXYCYCLINE HYCLATE 100 MG PO TABS: 100.0000 mg | ORAL_TABLET | Freq: Two times a day (BID) | ORAL | 0 refills | Status: DC

## 2022-03-01 MED ORDER — CIPROFLOXACIN HCL 0.3 % OP SOLN: 1.0000 [drp] | OPHTHALMIC | 0 refills | Status: AC

## 2022-03-01 NOTE — Patient Instructions (Signed)
You can take an OTC expectorant like guaifenesin or Mucinexto help decrease head and nasal congestion.  ? ?You can take an over the counter antihistamine to help with allergic rhinitis / itching / hives: NON-DROWSY Allegra (Fexofenadine) 180 mg daily or NON-Drowsy Claritin (Loratidine) 10 mg daily  or DROWSY Benadryl (Diphenhydramine) 25 mg as directed or Zyrtec (Cetirizine) 10 mg daily. You can go to a store with a pharmacy and ask them to help you find these medicines. ? ?For nasal congestion and post nasal drip you can use an OTC nasal saline rinse as well as one of the OTC nasal steroids (generic equivalent) like Flonase (Fluticasone) or Rhinocort (Budesonide) or Nasacort (Triamcinolone) or Nasonex (Mometasone Furoate).  ? ? ? ?

## 2022-03-01 NOTE — Progress Notes (Signed)
Start time: 9:15 am ?End time: 9:35 am ? ?Virtual Visit via Video Note ? ? Patient ID: Kelly Jordan, female    DOB: 02/05/1946, 76 y.o.   MRN: [id number] ? ?I connected with above patient on 03/01/22 by a video enabled telemedicine application and verified that I am speaking with the correct person using two identifiers. ? ?Location: ?Patient: home ?Provider: office ?  ?I discussed the limitations of evaluation and management by telemedicine and the availability of in person appointments. The patient expressed understanding and agreed to proceed. ? ?History of Present Illness: ? ?Chief Complaint  ?Patient presents with  ? Acute Visit  ?  Virtual- Eyes stuck together when she wakes up with green mucous and also when she blows her nose.  ? ?Reports congestion in nose and sinuses for 6 days, had a sore throat and it went away, can swallow and eat okay; has a decreased appetite; yellow green out of nose; eyes matted together for the past 3 mornings; denies fever / chills / nausea / vomiting / diarrhea / constipation; + post-nasal drip with a dry cough; has not done home COVID; + hx/o allergic rhinitis; denies recent travel; denies sick contacts; used an OTC eye drops that were not helpful; is using OTC sinus relief vapor cool saline spray and Allegra  ? ?  ?Observations/Objective: ? ?Ht '5\' 5"'$  (1.651 m)   Wt 173 lb (78.5 kg)   BMI 28.79 kg/m?  ? ? ?Assessment: ?Encounter Diagnoses  ?Name Primary?  ? Bacterial conjunctivitis of both eyes Yes  ? Acute non-recurrent pansinusitis   ? Non-seasonal allergic rhinitis due to pollen   ? ? ? ?Plan: ?Rest, increase clear fluids, OTC Tylenol (acetamenophen) for body aches, headaches, fever, chills as needed.  ? ?You can take an over the counter antihistamine to help with allergic rhinitis / itching / hives: NON-DROWSY Allegra (Fexofenadine) 180 mg daily or NON-Drowsy Claritin (Loratidine) 10 mg daily  or DROWSY Benadryl (Diphenhydramine) 25 mg as directed or Zyrtec  (Cetirizine) 10 mg daily. You can go to a store with a pharmacy and ask them to help you find these medicines. ? ?For nasal congestion and post nasal drip you can use an OTC nasal saline rinse as well as one of the OTC nasal steroids (generic equivalent) like Flonase (Fluticasone) or Rhinocort (Budesonide) or Nasacort (Triamcinolone) or Nasonex (Mometasone Furoate).  ? ?You can take an OTC expectorant like guaifenesin or Mucinex to help decrease head and nasal congestion.  ? ?ciloxan eye drops and doxycycline bid  ? ? ?Kelly Jordan was seen today for acute visit. ? ?Diagnoses and all orders for this visit: ? ?Bacterial conjunctivitis of both eyes ? ?Acute non-recurrent pansinusitis ? ?Non-seasonal allergic rhinitis due to pollen ? ?Other orders ?-     doxycycline (VIBRA-TABS) 100 MG tablet; Take 1 tablet (100 mg total) by mouth 2 (two) times daily. ?-     ciprofloxacin (CILOXAN) 0.3 % ophthalmic solution; Place 1 drop into both eyes every 4 (four) hours while awake for 7 days. 1 drop each eye, every 4 hours, while awake, for the next 7 days ? ? ? ?Follow up: as already scheduled ? ? ?I discussed the assessment and treatment plan with the patient. The patient was provided an opportunity to ask questions and all were answered. The patient agreed with the plan and demonstrated an understanding of the instructions. ?  ?The patient was advised to call back or seek an in-person evaluation if the symptoms worsen or if the  condition fails to improve as anticipated. For emergencies go to Urgent Care or the Emergency Department for immediate evaluation.  ? ?I spent 15 minutes dedicated to the care of this patient, including pre-visit review of records, face to face time, post-visit ordering of testing and documentation. ? ? ? ?Irene Pap, PA-C ?

## 2022-03-09 ENCOUNTER — Other Ambulatory Visit: Payer: Self-pay | Admitting: Medical

## 2022-03-29 ENCOUNTER — Ambulatory Visit: Payer: Medicare PPO | Admitting: Endocrinology

## 2022-04-02 ENCOUNTER — Encounter: Payer: Self-pay | Admitting: Gastroenterology

## 2022-04-08 ENCOUNTER — Ambulatory Visit: Payer: Medicare PPO | Admitting: Endocrinology

## 2022-04-08 ENCOUNTER — Encounter: Payer: Self-pay | Admitting: Endocrinology

## 2022-04-08 VITALS — BP 132/72 | HR 81 | Ht 65.0 in | Wt 174.0 lb

## 2022-04-08 DIAGNOSIS — E1165 Type 2 diabetes mellitus with hyperglycemia: Secondary | ICD-10-CM | POA: Diagnosis not present

## 2022-04-08 DIAGNOSIS — E119 Type 2 diabetes mellitus without complications: Secondary | ICD-10-CM

## 2022-04-08 DIAGNOSIS — I10 Essential (primary) hypertension: Secondary | ICD-10-CM

## 2022-04-08 DIAGNOSIS — E7800 Pure hypercholesterolemia, unspecified: Secondary | ICD-10-CM | POA: Diagnosis not present

## 2022-04-08 LAB — POCT GLYCOSYLATED HEMOGLOBIN (HGB A1C): Hemoglobin A1C: 8.6 % — AB (ref 4.0–5.6)

## 2022-04-08 LAB — POCT GLUCOSE (DEVICE FOR HOME USE): POC Glucose: 183 mg/dl — AB (ref 70–99)

## 2022-04-08 MED ORDER — TIRZEPATIDE 2.5 MG/0.5ML ~~LOC~~ SOAJ: 2.5000 mg | SUBCUTANEOUS | 0 refills | Status: DC

## 2022-04-08 NOTE — Patient Instructions (Addendum)
Check blood sugars on waking up 3-4 days a week  Also check blood sugars about 2 hours after meals and do this after different meals by rotation  Recommended blood sugar levels on waking up are 90-130 and about 2 hours after meal is 130-160  Please bring your blood sugar monitor to each visit, thank you  [facility] WITH The pen as shown once weekly on the same day of the week.  You may inject in the stomach, thigh or arm as indicated in the brochure given.  You will feel fullness of the stomach with starting the medication and should try to keep the portions at meals small.  You may experience nausea in the first few days which usually gets better over time   If any questions or concerns are present call the office or the  [facility] at [phone].   Start taking GLIPIZIDE 5 mg before dinnertime  If your blood sugars after breakfast or before lunch come down below 100 we will need to stop the glipizide in the morning.  Start back on your walking or other exercise program

## 2022-04-08 NOTE — Progress Notes (Signed)
Patient ID: Kelly Jordan, female   DOB: [birth year], 76 y.o.   MRN: [id number]           Reason for Appointment: Type II Diabetes follow-up   History of Present Illness   Diagnosis date: 2010  Previous history:  Since 2022 her A1c has ranged between 7.4 and 8.8 but only once below 8% Her medication history includes metformin, Prandin, Farxiga in the past Rybelsus was started in 2/22  Recent history:     Non-insulin hypoglycemic drugs: Metformin ER 2 g daily, Rybelsus 3 mg every day, glipizide 5 mg in a.m. and Jardiance 25 mg daily select    Side effects from medications: Abdominal discomfort and heartburn from 14 mg Rybelsus  Current self management, blood sugar patterns and problems identified:  A1c is about the same at 8.6 She had previously been on RYBELSUS 14 mg last year when her blood sugars were improving but because of heartburn and abdominal discomfort she stopped this and was given 3 mg She checks her blood sugars only in the morning Her weight has leveled off recently, previously at lost weight with starting Rybelsus and Jardiance No side effects from Jardiance  Exercise: None recently but previously going to exercise class III days a week Diet management: Not always avoiding sweets like ice cream      Monitors blood glucose: Once a day.    Glucometer: Accu-Chek  Blood Glucose readings from recall: Only checking readings in the mornings and did not bring monitor for download  PRE-MEAL Fasting Lunch Dinner Bedtime Overall  Glucose range: 160-190   ?   Mean/median:        Hypoglycemia:  none                        Dietician visit: Most recent: Possibly 2022 but no record available Weight control: 199  Wt Readings from Last 3 Encounters:  04/08/22 174 lb (78.9 kg)  03/01/22 173 lb (78.5 kg)  12/17/21 173 lb 12.8 oz (78.8 kg)            Diabetes labs:  Lab Results  Component Value Date   HGBA1C 8.6 (A) 04/08/2022   HGBA1C 8.8 (A) 12/17/2021    HGBA1C 7.4 (A) 09/10/2021   Lab Results  Component Value Date   LDLCALC 58 04/16/2021   CREATININE 0.87 04/16/2021     Allergies as of 04/08/2022   No Known Allergies      Medication List        Accurate as of Apr 08, 2022  4:16 PM. If you have any questions, ask your nurse or doctor.          Accu-Chek Aviva Plus test strip Generic drug: glucose blood TEST 2 TIMES A DAY IN VITRO 90 DAYS   aspirin EC 81 MG tablet Take 81 mg by mouth daily.   calcium-vitamin D 500-200 MG-UNIT tablet Commonly known as: OSCAL WITH D Take 1 tablet by mouth.   doxycycline 100 MG tablet Commonly known as: VIBRA-TABS Take 1 tablet (100 mg total) by mouth 2 (two) times daily.   empagliflozin 25 MG Tabs tablet Commonly known as: Jardiance Take 1 tablet (25 mg total) by mouth daily before breakfast.   glipiZIDE 5 MG tablet Commonly known as: GLUCOTROL Take 1 tablet (5 mg total) by mouth daily before breakfast.   metFORMIN 500 MG 24 hr tablet Commonly known as: GLUCOPHAGE-XR TAKE 4 TABLETS (2,000 MG TOTAL) BY MOUTH DAILY.   rosuvastatin  10 MG tablet Commonly known as: CRESTOR Take 1 tablet (10 mg total) by mouth daily.   Rybelsus 3 MG Tabs Generic drug: Semaglutide Take 3 mg by mouth daily.   telmisartan 20 MG tablet Commonly known as: MICARDIS TAKE 1 TABLET BY MOUTH EVERY DAY        Allergies: No Known Allergies  Past Medical History:  Diagnosis Date   Cataract    Diabetes mellitus without complication ([place])    Hyperlipidemia    Hypertension     Past Surgical History:  Procedure Laterality Date   Blefoplasty  Bilateral    BUNIONECTOMY Bilateral    CATARACT EXTRACTION Bilateral 2019   COLONOSCOPY  02/07/2014   POLYPECTOMY     ROTATOR CUFF REPAIR Right    TUBAL LIGATION      Family History  Problem Relation Age of Onset   Diabetes Sister    Colon cancer Neg Hx    Esophageal cancer Neg Hx    Rectal cancer Neg Hx    Stomach cancer Neg Hx    Colon polyps  Neg Hx     Social History:  reports that she has quit smoking. She has never used smokeless tobacco. She reports current alcohol use. She reports that she does not use drugs.  Review of Systems:  Last diabetic eye exam date 6/22, no retinopathy  Last foot exam date: 7/22   Hypertension: Mild and managed only with 20 mg of telmisartan  BP Readings from Last 3 Encounters:  04/08/22 132/72  12/17/21 140/82  09/10/21 (!) 100/58   Lab Results  Component Value Date   CREATININE 0.87 04/16/2021   CREATININE 0.73 01/12/2021     Hyperlipidemia: She has been treated with Crestor 10 mg by her PCP, most recent labs as follows    Lab Results  Component Value Date   CHOL 120 04/16/2021   Lab Results  Component Value Date   HDL 45 04/16/2021   Lab Results  Component Value Date   LDLCALC 58 04/16/2021   Lab Results  Component Value Date   TRIG 87 04/16/2021   Lab Results  Component Value Date   CHOLHDL 2.7 04/16/2021   No results found for: LDLDIRECT   Examination:   BP 132/72   Pulse 81   Ht '5\' 5"'$  (1.651 m)   Wt 174 lb (78.9 kg)   SpO2 98%   BMI 28.96 kg/m   Body mass index is 28.96 kg/m.    ASSESSMENT/ PLAN:    Diabetes type 2 with mild obesity:   Current regimen: Glipizide once a day, metformin, Jardiance and Rybelsus 3 mg  A1c is now 8.6  Blood glucose control has been consistently poor except for once when she was likely taking 14 mg Rybelsus  She is on a 4 drug regimen but likely not benefiting from the lowest dose of Rybelsus 14 mg caused her GI side effects but not clear if she was doing better on 7 mg Likely she will do better with an injectable GLP-1 drug  Also she will likely do better when she is back on a regular exercise program and tries to watch her diet more consistently For now we will give her a sample of MOUNJARO to start with and continue other medication Also increase GLIPIZIDE to twice a day before breakfast and supper Check blood  sugars consistently AFTER different meals by rotation and bring monitor for download on next visit Explained that she is likely getting gradually progressive insulin deficient and  will need to be more aggressive with medications as well as diet and exercise  Discussed with the patient the action of GIP/GLP-1 drugs, the effects on pancreatic and liver function, effects on brain and stomach with improved satiety, slowing gastric emptying, improving satiety and reducing liver glucose output.  Discussed the effects on promoting weight loss. Explained possible side effects of MOUNJARO, most commonly nausea that usually improves over time; discussed safety information in package insert.  Demonstrated the medication injection device and injection technique to the patient.  Showed patient the injection sites for his medication To start with 2.5 mg dosage weekly for the first 4 injections and then increase the dose to 5 mg weekly  Patient brochure on Mounjaro and explained use of the available co-pay card   HYPERLIPIDEMIA and hypertension: Has been followed by PCP  Needs follow-up on lipids and renal function as well as microalbumin which is overdue  Follow-up in about 6 weeks  There are no Patient Instructions on file for this visit.   [name] 04/08/2022, 4:16 PM   Addendum: Potassium slightly high at 5.3, will need repeat testing

## 2022-04-09 ENCOUNTER — Encounter: Payer: Self-pay | Admitting: Endocrinology

## 2022-04-09 LAB — COMPREHENSIVE METABOLIC PANEL
ALT: 22 U/L (ref 0–35)
AST: 20 U/L (ref 0–37)
Albumin: 4.5 g/dL (ref 3.5–5.2)
Alkaline Phosphatase: 77 U/L (ref 39–117)
BUN: 18 mg/dL (ref 6–23)
CO2: 29 mEq/L (ref 19–32)
Calcium: 10.2 mg/dL (ref 8.4–10.5)
Chloride: 104 mEq/L (ref 96–112)
Creatinine, Ser: 0.94 mg/dL (ref 0.40–1.20)
GFR: 58.42 mL/min — ABNORMAL LOW (ref >60.00)
Glucose, Bld: 181 mg/dL — ABNORMAL HIGH (ref 70–99)
Potassium: 5.3 mEq/L — ABNORMAL HIGH (ref 3.5–5.1)
Sodium: 140 mEq/L (ref 135–145)
Total Bilirubin: 0.5 mg/dL (ref 0.2–1.2)
Total Protein: 7 g/dL (ref 6.0–8.3)

## 2022-04-09 LAB — MICROALBUMIN / CREATININE URINE RATIO
Creatinine,U: 20.9 mg/dL
Microalb Creat Ratio: 3.3 mg/g (ref 0.0–30.0)
Microalb, Ur: <0.7 mg/dL (ref 0.0–1.9)

## 2022-04-09 LAB — LIPID PANEL
Cholesterol: 115 mg/dL (ref 0–200)
HDL: 44.4 mg/dL (ref >39.00)
LDL Cholesterol: 45 mg/dL (ref 0–99)
NonHDL: 70.98
Total CHOL/HDL Ratio: 3
Triglycerides: 131 mg/dL (ref 0.0–149.0)
VLDL: 26.2 mg/dL (ref 0.0–40.0)

## 2022-04-13 ENCOUNTER — Other Ambulatory Visit: Payer: Self-pay

## 2022-04-13 ENCOUNTER — Other Ambulatory Visit (INDEPENDENT_AMBULATORY_CARE_PROVIDER_SITE_OTHER): Payer: Medicare PPO

## 2022-04-13 ENCOUNTER — Other Ambulatory Visit: Payer: Self-pay | Admitting: Endocrinology

## 2022-04-13 DIAGNOSIS — E1165 Type 2 diabetes mellitus with hyperglycemia: Secondary | ICD-10-CM | POA: Diagnosis not present

## 2022-04-13 DIAGNOSIS — E119 Type 2 diabetes mellitus without complications: Secondary | ICD-10-CM

## 2022-04-13 LAB — BASIC METABOLIC PANEL
BUN: 16 mg/dL (ref 6–23)
CO2: 25 mEq/L (ref 19–32)
Calcium: 9.4 mg/dL (ref 8.4–10.5)
Chloride: 103 mEq/L (ref 96–112)
Creatinine, Ser: 0.9 mg/dL (ref 0.40–1.20)
GFR: 61.55 mL/min (ref >60.00)
Glucose, Bld: 184 mg/dL — ABNORMAL HIGH (ref 70–99)
Potassium: 3.7 mEq/L (ref 3.5–5.1)
Sodium: 136 mEq/L (ref 135–145)

## 2022-04-13 MED ORDER — GLIPIZIDE 5 MG PO TABS: ORAL_TABLET | ORAL | 3 refills | Status: DC

## 2022-04-14 LAB — FRUCTOSAMINE: Fructosamine: 297 umol/L — ABNORMAL HIGH (ref 0–285)

## 2022-04-19 ENCOUNTER — Ambulatory Visit: Payer: Medicare PPO | Admitting: Family Medicine

## 2022-04-20 ENCOUNTER — Ambulatory Visit: Payer: Medicare PPO | Admitting: Physician Assistant

## 2022-04-26 ENCOUNTER — Other Ambulatory Visit: Payer: Self-pay

## 2022-04-26 DIAGNOSIS — E1165 Type 2 diabetes mellitus with hyperglycemia: Secondary | ICD-10-CM

## 2022-04-26 MED ORDER — TIRZEPATIDE 5 MG/0.5ML ~~LOC~~ SOAJ: 5.0000 mg | SUBCUTANEOUS | 0 refills | Status: DC

## 2022-05-14 ENCOUNTER — Encounter: Payer: Self-pay | Admitting: Internal Medicine

## 2022-05-26 ENCOUNTER — Encounter: Payer: Self-pay | Admitting: Gastroenterology

## 2022-05-28 ENCOUNTER — Ambulatory Visit (INDEPENDENT_AMBULATORY_CARE_PROVIDER_SITE_OTHER): Payer: Medicare PPO

## 2022-05-28 VITALS — BP 122/64 | HR 86 | Temp 97.9°F | Ht 64.0 in | Wt 164.6 lb

## 2022-05-28 DIAGNOSIS — Z0000 Encounter for general adult medical examination without abnormal findings: Secondary | ICD-10-CM

## 2022-05-28 NOTE — Patient Instructions (Signed)
Ms. Kelly Jordan , Thank you for taking time to come for your Medicare Wellness Visit. I appreciate your ongoing commitment to your health goals. Please review the following plan we discussed and let me know if I can assist you in the future.   Screening recommendations/referrals: Colonoscopy: not required Mammogram: completed 07/30/2021, due 07/31/2022 Bone Density: completed 01/10/2020 Recommended yearly ophthalmology/optometry visit for glaucoma screening and checkup Recommended yearly dental visit for hygiene and checkup  Vaccinations: Influenza vaccine: due 06/15/2022 Pneumococcal vaccine: completed 04/16/2021 Tdap vaccine: completed 06/11/2021, due 06/12/2031 Shingles vaccine: completed   Covid-19: 08/18/2021, 08/11/2020, 12/31/2019, 12/10/2019  Advanced directives: copy in chart  Conditions/risks identified: none  Next appointment: Follow up in one year for your annual wellness visit    Preventive Care 52 Years and Older, Female Preventive care refers to lifestyle choices and visits with your health care provider that can promote health and wellness. What does preventive care include? A yearly physical exam. This is also called an annual well check. Dental exams once or twice a year. Routine eye exams. Ask your health care provider how often you should have your eyes checked. Personal lifestyle choices, including: Daily care of your teeth and gums. Regular physical activity. Eating a healthy diet. Avoiding tobacco and drug use. Limiting alcohol use. Practicing safe sex. Taking low-dose aspirin every day. Taking vitamin and mineral supplements as recommended by your health care provider. What happens during an annual well check? The services and screenings done by your health care provider during your annual well check will depend on your age, overall health, lifestyle risk factors, and family history of disease. Counseling  Your health care provider may ask you questions about  your: Alcohol use. Tobacco use. Drug use. Emotional well-being. Home and relationship well-being. Sexual activity. Eating habits. History of falls. Memory and ability to understand (cognition). Work and work Statistician. Reproductive health. Screening  You may have the following tests or measurements: Height, weight, and BMI. Blood pressure. Lipid and cholesterol levels. These may be checked every 5 years, or more frequently if you are over 36 years old. Skin check. Lung cancer screening. You may have this screening every year starting at age 35 if you have a 30-pack-year history of smoking and currently smoke or have quit within the past 15 years. Fecal occult blood test (FOBT) of the stool. You may have this test every year starting at age 27. Flexible sigmoidoscopy or colonoscopy. You may have a sigmoidoscopy every 5 years or a colonoscopy every 10 years starting at age 70. Hepatitis C blood test. Hepatitis B blood test. Sexually transmitted disease (STD) testing. Diabetes screening. This is done by checking your blood sugar (glucose) after you have not eaten for a while (fasting). You may have this done every 1-3 years. Bone density scan. This is done to screen for osteoporosis. You may have this done starting at age 35. Mammogram. This may be done every 1-2 years. Talk to your health care provider about how often you should have regular mammograms. Talk with your health care provider about your test results, treatment options, and if necessary, the need for more tests. Vaccines  Your health care provider may recommend certain vaccines, such as: Influenza vaccine. This is recommended every year. Tetanus, diphtheria, and acellular pertussis (Tdap, Td) vaccine. You may need a Td booster every 10 years. Zoster vaccine. You may need this after age 69. Pneumococcal 13-valent conjugate (PCV13) vaccine. One dose is recommended after age 44. Pneumococcal polysaccharide (PPSV23) vaccine.  One dose  is recommended after age 16. Talk to your health care provider about which screenings and vaccines you need and how often you need them. This information is not intended to replace advice given to you by your health care provider. Make sure you discuss any questions you have with your health care provider. Document Released: 11/28/2015 Document Revised: 07/21/2016 Document Reviewed: 09/02/2015 Elsevier Interactive Patient Education  2017 [place] Prevention in the Home Falls can cause injuries. They can happen to people of all ages. There are many things you can do to make your home safe and to help prevent falls. What can I do on the outside of my home? Regularly fix the edges of walkways and driveways and fix any cracks. Remove anything that might make you trip as you walk through a door, such as a raised step or threshold. Trim any bushes or trees on the path to your home. Use bright outdoor lighting. Clear any walking paths of anything that might make someone trip, such as rocks or tools. Regularly check to see if handrails are loose or broken. Make sure that both sides of any steps have handrails. Any raised decks and porches should have guardrails on the edges. Have any leaves, snow, or ice cleared regularly. Use sand or salt on walking paths during winter. Clean up any spills in your garage right away. This includes oil or grease spills. What can I do in the bathroom? Use night lights. Install grab bars by the toilet and in the tub and shower. Do not use towel bars as grab bars. Use non-skid mats or decals in the tub or shower. If you need to sit down in the shower, use a plastic, non-slip stool. Keep the floor dry. Clean up any water that spills on the floor as soon as it happens. Remove soap buildup in the tub or shower regularly. Attach bath mats securely with double-sided non-slip rug tape. Do not have throw rugs and other things on the floor that can make  you trip. What can I do in the bedroom? Use night lights. Make sure that you have a light by your bed that is easy to reach. Do not use any sheets or blankets that are too big for your bed. They should not hang down onto the floor. Have a firm chair that has side arms. You can use this for support while you get dressed. Do not have throw rugs and other things on the floor that can make you trip. What can I do in the kitchen? Clean up any spills right away. Avoid walking on wet floors. Keep items that you use a lot in easy-to-reach places. If you need to reach something above you, use a strong step stool that has a grab bar. Keep electrical cords out of the way. Do not use floor polish or wax that makes floors slippery. If you must use wax, use non-skid floor wax. Do not have throw rugs and other things on the floor that can make you trip. What can I do with my stairs? Do not leave any items on the stairs. Make sure that there are handrails on both sides of the stairs and use them. Fix handrails that are broken or loose. Make sure that handrails are as long as the stairways. Check any carpeting to make sure that it is firmly attached to the stairs. Fix any carpet that is loose or worn. Avoid having throw rugs at the top or bottom of the stairs. If  you do have throw rugs, attach them to the floor with carpet tape. Make sure that you have a light switch at the top of the stairs and the bottom of the stairs. If you do not have them, ask someone to add them for you. What else can I do to help prevent falls? Wear shoes that: Do not have high heels. Have rubber bottoms. Are comfortable and fit you well. Are closed at the toe. Do not wear sandals. If you use a stepladder: Make sure that it is fully opened. Do not climb a closed stepladder. Make sure that both sides of the stepladder are locked into place. Ask someone to hold it for you, if possible. Clearly mark and make sure that you can  see: Any grab bars or handrails. First and last steps. Where the edge of each step is. Use tools that help you move around (mobility aids) if they are needed. These include: Canes. Walkers. Scooters. Crutches. Turn on the lights when you go into a dark area. Replace any light bulbs as soon as they burn out. Set up your furniture so you have a clear path. Avoid moving your furniture around. If any of your floors are uneven, fix them. If there are any pets around you, be aware of where they are. Review your medicines with your doctor. Some medicines can make you feel dizzy. This can increase your chance of falling. Ask your doctor what other things that you can do to help prevent falls. This information is not intended to replace advice given to you by your health care provider. Make sure you discuss any questions you have with your health care provider. Document Released: 08/28/2009 Document Revised: 04/08/2016 Document Reviewed: 12/06/2014 Elsevier Interactive Patient Education  2017 [employer].

## 2022-05-28 NOTE — Progress Notes (Signed)
Subjective:   Kelly Jordan is a 76 y.o. female who presents for Medicare Annual (Subsequent) preventive examination.  Review of Systems    Cardiac Risk Factors include: advanced age (>80mn, >>1women);diabetes mellitus;hypertension     Objective:    Today's Vitals   05/28/22 0955  BP: 122/64  Pulse: 86  Temp: 97.9 F (36.6 C)  TempSrc: Oral  SpO2: 95%  Weight: 164 lb 9.6 oz (74.7 kg)  Height: '5\' 4"'$  (1.626 m)   Body mass index is 28.25 kg/m.     05/28/2022   10:06 AM  Advanced Directives  Does Patient Have a Medical Advance Directive? Yes  Type of AParamedicof [place] will  Copy of [place]? Yes - validated most recent copy scanned in chart (See row information)    Current Medications (verified) Outpatient Encounter Medications as of 05/28/2022  Medication Sig   aspirin EC 81 MG tablet Take 81 mg by mouth daily.   calcium-vitamin D (OSCAL WITH D) 500-200 MG-UNIT tablet Take 1 tablet by mouth.   glipiZIDE (GLUCOTROL) 5 MG tablet Take 1 tablet before breakfast and 1 tablet before dinner (Patient taking differently: Take 1 tablet before breakfast and 1 tablet before dinner, Has been taking once in the morning)   glucose blood (ACCU-CHEK AVIVA PLUS) test strip TEST 2 TIMES A DAY IN VITRO 90 DAYS   JARDIANCE 25 MG TABS tablet TAKE 1 TABLET BY MOUTH DAILY BEFORE BREAKFAST.   metFORMIN (GLUCOPHAGE-XR) 500 MG 24 hr tablet TAKE 4 TABLETS (2,000 MG TOTAL) BY MOUTH DAILY.   rosuvastatin (CRESTOR) 10 MG tablet TAKE 1 TABLET BY MOUTH EVERY DAY   telmisartan (MICARDIS) 20 MG tablet TAKE 1 TABLET BY MOUTH EVERY DAY   tirzepatide (MOUNJARO) 5 MG/0.5ML Pen Inject 5 mg into the skin once a week.   doxycycline (VIBRA-TABS) 100 MG tablet Take 1 tablet (100 mg total) by mouth 2 (two) times daily. (Patient not taking: Reported on 04/08/2022)   Semaglutide (RYBELSUS) 3 MG TABS Take 3 mg by mouth daily. (Patient not taking:  Reported on 05/28/2022)   No facility-administered encounter medications on file as of 05/28/2022.    Allergies (verified) Patient has no known allergies.   History: Past Medical History:  Diagnosis Date   Cataract    Di[place]    Hyperlipidemia    Hypertension    Past Surgical History:  Procedure Laterality Date   Blefoplasty  Bilateral    BUNIONECTOMY Bilateral    CATARACT EXTRACTION Bilateral 2019   COLONOSCOPY  02/07/2014   POLYPECTOMY     ROTATOR CUFF REPAIR Right    TUBAL LIGATION     Family History  Problem Relation Age of Onset   Diabetes Sister    Colon cancer Neg Hx    Esophageal cancer Neg Hx    Rectal cancer Neg Hx    Stomach cancer Neg Hx    Colon polyps Neg Hx    Social History   Socioeconomic History   Marital status: Widowed    Spouse name: Not on file   Number of children: Not on file   Years of education: Not on file   Highest education level: Not on file  Occupational History   Not on file  Tobacco Use   Smoking status: Former   Smokeless tobacco: Never   Tobacco comments:    Quit about 14 years ago.   Vaping Use   Vaping Use: Never used  Substance and Sexual Activity   Alcohol use: Yes    Comment: Occasional wine   Drug use: Never   Sexual activity: Not on file  Other Topics Concern   Not on file  Social History Narrative   Not on file   Social Determinants of Health   Financial Resource Strain: Low Risk  (05/28/2022)   Overall Financial Resource Strain (CARDIA)    Difficulty of Paying Living Expenses: Not hard at all  Food Insecurity: No Food Insecurity (05/28/2022)   Hunger Vital Sign    Worried About Running Out of Food in the Last Year: Never true    Ran Out of Food in the Last Year: Never true  Transportation Needs: No Transportation Needs (05/28/2022)   PRAPARE - Hydrologist (Medical): No    Lack of Transportation (Non-Medical): No  Physical Activity:  Sufficiently Active (05/28/2022)   Exercise Vital Sign    Days of Exercise per Week: 3 days    Minutes of Exercise per Session: 60 min  Stress: No Stress Concern Present (05/28/2022)   [place]    Feeling of Stress : Not at all  Social Connections: Not on file    Tobacco Counseling Counseling given: Not Answered Tobacco comments: Quit about 14 years ago.    Clinical Intake:  Pre-visit preparation completed: Yes  Pain : No/denies pain     Nutritional Status: BMI 25 -29 Overweight Nutritional Risks: None Diabetes: Yes  How often do you need to have someone help you when you read instructions, pamphlets, or other written materials from your doctor or pharmacy?: 1 - Never What is the last grade level you completed in school?: 73yr college  Diabetic? Yes Nutrition Risk Assessment:  Has the patient had any N/V/D within the last 2 months?  No  Does the patient have any non-healing wounds?  No  Has the patient had any unintentional weight loss or weight gain?  No   Diabetes:  Is the patient diabetic?  Yes  If diabetic, was a CBG obtained today?  No  Did the patient bring in their glucometer from home?  No  How often do you monitor your CBG's? At least daily but some times more.   Financial Strains and Diabetes Management:  Are you having any financial strains with the device, your supplies or your medication? No .  Does the patient want to be seen by Chronic Care Management for management of their diabetes?  No  Would the patient like to be referred to a Nutritionist or for Diabetic Management?  No   Diabetic Exams:  Diabetic Eye Exam: Overdue for diabetic eye exam. Pt has been advised about the importance in completing this exam. Patient advised to call and schedule an eye exam. Diabetic Foot Exam: Completed 05/28/2021   Interpreter Needed?: No  Information entered by :: NAllen LPN   Activities of  Daily Living    05/28/2022   10:09 AM  In your present state of health, do you have any difficulty performing the following activities:  Hearing? 0  Vision? 0  Difficulty concentrating or making decisions? 0  Walking or climbing stairs? 0  Dressing or bathing? 0  Doing errands, shopping? 0  Preparing Food and eating ? N  Using the Toilet? N  In the past six months, have you accidently leaked urine? Y  Comment held a little too long  Do you have problems with loss of bowel  control? N  Managing your Medications? N  Managing your Finances? N  Housekeeping or managing your Housekeeping? N    Patient Care Team: Tysinger, Camelia Eng, PA-C as PCP - General (Family Medicine)  Indicate any recent Medical Services you may have received from other than [facility] providers in the past year (date may be approximate).     Assessment:   This is a routine wellness examination for Shanty.  Hearing/Vision screen Vision Screening - Comments:: Regular eye exams, Dr. Ellie Lunch, [facility]  Dietary issues and exercise activities discussed: Current Exercise Habits: Home exercise routine, Type of exercise: walking, Time (Minutes): 60, Frequency (Times/Week): 3, Weekly Exercise (Minutes/Week): 180   Goals Addressed             This Visit's Progress    Patient Stated       05/28/2022, do better with blood sugars and eat healthy       Depression Screen    05/28/2022   10:07 AM 04/16/2021    8:40 AM 01/12/2021    1:43 PM  PHQ 2/9 Scores  PHQ - 2 Score 0 0 0  PHQ- 9 Score 0      Fall Risk    05/28/2022   10:07 AM 04/16/2021    8:40 AM 01/12/2021    1:43 PM  Fall Risk   Falls in the past year? 0 0 0  Number falls in past yr: 0 0 0  Injury with Fall? 0 0 0  Risk for fall due to : Medication side effect No Fall Risks No Fall Risks  Follow up Falls evaluation completed;Education provided;Falls prevention discussed Falls evaluation completed Falls evaluation completed    FALL RISK PREVENTION  PERTAINING TO THE HOME:  Any stairs in or around the home? Yes  If so, are there any without handrails? No  Home free of loose throw rugs in walkways, pet beds, electrical cords, etc? Yes  Adequate lighting in your home to reduce risk of falls? Yes   ASSISTIVE DEVICES UTILIZED TO PREVENT FALLS:  Life alert? No  Use of a cane, walker or w/c? No  Grab bars in the bathroom? Yes  Shower chair or bench in shower? Yes  Elevated toilet seat or a handicapped toilet? Yes   TIMED UP AND GO:  Was the test performed? No .    Gait steady and fast without use of assistive device  Cognitive Function:        05/28/2022   10:10 AM  6CIT Screen  What Year? 0 points  What month? 0 points  What time? 0 points  Count back from 20 0 points  Months in reverse 0 points  Repeat phrase 2 points  Total Score 2 points    Immunizations Immunization History  Administered Date(s) Administered   Hepatitis B, adult 08/01/2002, 09/10/2002, 02/11/2003   Influenza, Quadrivalent, Recombinant, Inj, Pf 08/01/2017, 07/26/2018, 08/22/2019, 07/25/2020   Influenza-Unspecified 11/15/2012   PFIZER(Purple Top)SARS-COV-2 Vaccination 12/10/2019, 12/31/2019, 08/11/2020   Pneumococcal Conjugate-13 10/30/2013   Pneumococcal Polysaccharide-23 04/16/2021   Pneumococcal-Unspecified 02/20/2010, 11/15/2010, 12/07/2018   Tdap 08/06/2009, 11/15/2010   Zoster, Live 03/12/2010, 11/15/2010, 10/27/2015    TDAP status: Up to date  Flu Vaccine status: Up to date  Pneumococcal vaccine status: Up to date  Covid-19 vaccine status: Completed vaccines  Qualifies for Shingles Vaccine? Yes   Zostavax completed Yes   Shingrix Completed?: Yes  Screening Tests Health Maintenance  Topic Date Due   OPHTHALMOLOGY EXAM  04/21/2022   COLONOSCOPY (Pts 45-92yr  Insurance coverage will need to be confirmed)  05/07/2022   FOOT EXAM  05/28/2022   INFLUENZA VACCINE  06/15/2022   HEMOGLOBIN A1C  10/09/2022   TETANUS/TDAP   06/12/2031   Pneumonia Vaccine 39+ Years old  Completed   DEXA SCAN  Completed   COVID-19 Vaccine  Completed   Hepatitis C Screening  Completed   Zoster Vaccines- Shingrix  Completed   HPV VACCINES  Aged Out    Health Maintenance  Health Maintenance Due  Topic Date Due   OPHTHALMOLOGY EXAM  04/21/2022   COLONOSCOPY (Pts 45-42yr Insurance coverage will need to be confirmed)  05/07/2022   FOOT EXAM  05/28/2022    Colorectal cancer screening: Type of screening: Colonoscopy. Completed 05/08/2019. Repeat every 3-5 years  Mammogram status: Completed 07/30/2021. Repeat every year  Bone Density status: Completed 01/10/2020.   Lung Cancer Screening: (Low Dose CT Chest recommended if Age 78-80years, 30 pack-year currently smoking OR have quit w/in 15years.) does not qualify.   Lung Cancer Screening Referral: no  Additional Screening:  Hepatitis C Screening: does qualify; Completed 04/16/2021  Vision Screening: Recommended annual ophthalmology exams for early detection of glaucoma and other disorders of the eye. Is the patient up to date with their annual eye exam?  Yes  Who is the provider or what is the name of the office in which the patient attends annual eye exams? Dr. MEllie LunchIf pt is not established with a provider, would they like to be referred to a provider to establish care? No .   Dental Screening: Recommended annual dental exams for proper oral hygiene  Community Resource Referral / Chronic Care Management: CRR required this visit?  No   CCM required this visit?  No      Plan:     I have personally reviewed and noted the following in the patient's chart:   Medical and social history Use of alcohol, tobacco or illicit drugs  Current medications and supplements including opioid prescriptions.  Functional ability and status Nutritional status Physical activity Advanced directives List of other physicians Hospitalizations, surgeries, and ER visits in previous 12  months Vitals Screenings to include cognitive, depression, and falls Referrals and appointments  In addition, I have reviewed and discussed with patient certain preventive protocols, quality metrics, and best practice recommendations. A written personalized care plan for preventive services as well as general preventive health recommendations were provided to patient.     NKellie Simmering LPN   [id number]  Nurse Notes: none

## 2022-05-31 ENCOUNTER — Ambulatory Visit: Payer: Medicare PPO | Admitting: Physician Assistant

## 2022-06-01 ENCOUNTER — Other Ambulatory Visit: Payer: Self-pay

## 2022-06-01 DIAGNOSIS — E1165 Type 2 diabetes mellitus with hyperglycemia: Secondary | ICD-10-CM

## 2022-06-01 DIAGNOSIS — E119 Type 2 diabetes mellitus without complications: Secondary | ICD-10-CM | POA: Diagnosis not present

## 2022-06-01 DIAGNOSIS — H52203 Unspecified astigmatism, bilateral: Secondary | ICD-10-CM | POA: Diagnosis not present

## 2022-06-01 DIAGNOSIS — Z961 Presence of intraocular lens: Secondary | ICD-10-CM | POA: Diagnosis not present

## 2022-06-01 LAB — HM DIABETES EYE EXAM

## 2022-06-01 MED ORDER — TIRZEPATIDE 5 MG/0.5ML ~~LOC~~ SOAJ: 5.0000 mg | SUBCUTANEOUS | 0 refills | Status: DC

## 2022-06-03 ENCOUNTER — Encounter: Payer: Self-pay | Admitting: Internal Medicine

## 2022-06-03 ENCOUNTER — Encounter: Payer: Self-pay | Admitting: Medical

## 2022-06-03 ENCOUNTER — Ambulatory Visit (INDEPENDENT_AMBULATORY_CARE_PROVIDER_SITE_OTHER): Payer: Medicare PPO | Admitting: Medical

## 2022-06-03 VITALS — BP 110/60 | HR 87 | Ht 64.0 in | Wt 163.0 lb

## 2022-06-03 DIAGNOSIS — I10 Essential (primary) hypertension: Secondary | ICD-10-CM

## 2022-06-03 DIAGNOSIS — E1169 Type 2 diabetes mellitus with other specified complication: Secondary | ICD-10-CM

## 2022-06-03 DIAGNOSIS — E118 Type 2 diabetes mellitus with unspecified complications: Secondary | ICD-10-CM | POA: Diagnosis not present

## 2022-06-03 DIAGNOSIS — K635 Polyp of colon: Secondary | ICD-10-CM

## 2022-06-03 DIAGNOSIS — M8580 Other specified disorders of bone density and structure, unspecified site: Secondary | ICD-10-CM

## 2022-06-03 DIAGNOSIS — Z780 Asymptomatic menopausal state: Secondary | ICD-10-CM

## 2022-06-03 DIAGNOSIS — H6121 Impacted cerumen, right ear: Secondary | ICD-10-CM

## 2022-06-03 DIAGNOSIS — Z0000 Encounter for general adult medical examination without abnormal findings: Secondary | ICD-10-CM

## 2022-06-03 DIAGNOSIS — Z23 Encounter for immunization: Secondary | ICD-10-CM

## 2022-06-03 DIAGNOSIS — Z136 Encounter for screening for cardiovascular disorders: Secondary | ICD-10-CM

## 2022-06-03 DIAGNOSIS — E785 Hyperlipidemia, unspecified: Secondary | ICD-10-CM

## 2022-06-03 LAB — CBC
Hematocrit: 41.2 % (ref 34.0–46.6)
Hemoglobin: 14.2 g/dL (ref 11.1–15.9)
MCH: 29.8 pg (ref 26.6–33.0)
MCHC: 34.5 g/dL (ref 31.5–35.7)
MCV: 87 fL (ref 79–97)
Platelets: 241 10*3/uL (ref 150–450)
RBC: 4.76 x10E6/uL (ref 3.77–5.28)
RDW: 13 % (ref 11.7–15.4)
WBC: 7 10*3/uL (ref 3.4–10.8)

## 2022-06-03 NOTE — Progress Notes (Signed)
Subjective:    Kelly Jordan is a 76 y.o. female who presents for Preventative Services visit and chronic medical problems/med check visit.    Primary Care Provider Tysinger, Camelia Eng, PA-C here for primary care  Current Health Care Team: Dentist, Dr. Augustina Mood  Eye doctor, Dr. Ellie Lunch Dr. [name], endocrinology Dr. Lucio Edward, [place] you may have received from other than [facility] providers in the past year (date may be approximate) Dr. Dwyane Dee- Endo  Exercise Current exercise habits:  walking when she can    Nutrition/Diet Current diet: in general, a "healthy" diet    Depression Screen    05/28/2022   10:07 AM  Depression screen PHQ 2/9  Decreased Interest 0  Down, Depressed, Hopeless 0  PHQ - 2 Score 0  Altered sleeping 0  Tired, decreased energy 0  Change in appetite 0  Feeling bad or failure about yourself  0  Trouble concentrating 0  Moving slowly or fidgety/restless 0  Suicidal thoughts 0  PHQ-9 Score 0  Difficult doing work/chores Not difficult at all     Can patient draw a clock face showing 3:15 oclock, had AWV last week  Fall Risk Screen    05/28/2022   10:07 AM 04/16/2021    8:40 AM 01/12/2021    1:43 PM  Fall Risk   Falls in the past year? 0 0 0  Number falls in past yr: 0 0 0  Injury with Fall? 0 0 0  Risk for fall due to : Medication side effect No Fall Risks No Fall Risks  Follow up Falls evaluation completed;Education provided;Falls prevention discussed Falls evaluation completed Falls evaluation completed    Gait Assessment: Normal gait observed yes  Advanced directives Does patient have a [place]? Yes Does patient have a Living Will? Yes  Past Medical History:  Diagnosis Date   Cataract    Diabetes mellitus without complication ([place])    Hyperlipidemia    Hypertension     Past Surgical History:  Procedure Laterality Date   Blefoplasty  Bilateral    BUNIONECTOMY Bilateral    CATARACT  EXTRACTION Bilateral 2019   COLONOSCOPY  02/07/2014   POLYPECTOMY     ROTATOR CUFF REPAIR Right    TUBAL LIGATION      Social History   Socioeconomic History   Marital status: Widowed    Spouse name: Not on file   Number of children: Not on file   Years of education: Not on file   Highest education level: Not on file  Occupational History   Not on file  Tobacco Use   Smoking status: Former   Smokeless tobacco: Never   Tobacco comments:        Quit about 14 years ago.   Vaping Use   Vaping Use: Never used  Substance and Sexual Activity   Alcohol use: Yes    Comment: Occasional wine   Drug use: Never   Sexual activity: Not on file  Other Topics Concern   Not on file  Social History Narrative   Lives with son.   Has 3 children, 6 grandchildren.   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  Exercise  - walking and exercise at at [facility].   05/2022   Social Determinants of Health   Financial Resource Strain: Low Risk  (05/28/2022)   Overall Financial Resource Strain (CARDIA)    Difficulty of Paying Living Expenses: Not hard at all  Food  Insecurity: No Food Insecurity (05/28/2022)   Hunger Vital Sign    Worried About Running Out of Food in the Last Year: Never true    Ran Out of Food in the Last Year: Never true  Transportation Needs: No Transportation Needs (05/28/2022)   PRAPARE - Hydrologist (Medical): No    Lack of Transportation (Non-Medical): No  Physical Activity: Sufficiently Active (05/28/2022)   Exercise Vital Sign    Days of Exercise per Week: 3 days    Minutes of Exercise per Session: 60 min  Stress: No Stress Concern Present (05/28/2022)   Oketo    Feeling of Stress : Not at all  Social Connections: Not on file  Intimate Partner Violence: Not on file    Family History  Problem Relation Age of Onset   Diabetes Sister    Colon cancer Neg Hx     Esophageal cancer Neg Hx    Rectal cancer Neg Hx    Stomach cancer Neg Hx    Colon polyps Neg Hx      Current Outpatient Medications:    aspirin EC 81 MG tablet, Take 81 mg by mouth daily., Disp: , Rfl:    calcium-vitamin D (OSCAL WITH D) 500-200 MG-UNIT tablet, Take 1 tablet by mouth., Disp: , Rfl:    glipiZIDE (GLUCOTROL) 5 MG tablet, Take 1 tablet before breakfast and 1 tablet before dinner (Patient taking differently: Take 1 tablet before breakfast and 1 tablet before dinner, Has been taking once in the morning), Disp: 180 tablet, Rfl: 3   JARDIANCE 25 MG TABS tablet, TAKE 1 TABLET BY MOUTH DAILY BEFORE BREAKFAST., Disp: 90 tablet, Rfl: 3   metFORMIN (GLUCOPHAGE-XR) 500 MG 24 hr tablet, TAKE 4 TABLETS (2,000 MG TOTAL) BY MOUTH DAILY., Disp: 360 tablet, Rfl: 3   rosuvastatin (CRESTOR) 10 MG tablet, TAKE 1 TABLET BY MOUTH EVERY DAY, Disp: 90 tablet, Rfl: 0   telmisartan (MICARDIS) 20 MG tablet, TAKE 1 TABLET BY MOUTH EVERY DAY, Disp: 60 tablet, Rfl: 1   tirzepatide (MOUNJARO) 5 MG/0.5ML Pen, Inject 5 mg into the skin once a week., Disp: 2 mL, Rfl: 0   glucose blood (ACCU-CHEK AVIVA PLUS) test strip, TEST 2 TIMES A DAY IN VITRO 90 DAYS, Disp: 200 strip, Rfl: 3  No Known Allergies  History reviewed: allergies, current medications, past family history, past medical history, past social history, past surgical history and problem list  Chronic issues discussed: hypertension - compliant with medication, no chest pain, no palpitation, no DOE  Hyperlipidemia-compliant with statin without complaint  Diabetes-sees endocrinology.  She notes that since she has been on Mounjaro the last 3 months her sugars are starting to get way better than they were.  She is actually seen a couple readings even as low as 70 or 54.  She sees endocrinology soon.  They did talk about possibly cutting back on glipizide if she was getting low readings.   Acute issues discussed: She was in [place] recently.  Ever  since she has been back her right ear feels like there could be fluid in the ear   Objective:      Biometrics BP 110/60   Pulse 87   Ht '5\' 4"'$  (1.626 m)   Wt 163 lb (73.9 kg)   BMI 27.98 kg/m   Wt Readings from Last 3 Encounters:  06/03/22 163 lb (73.9 kg)  05/28/22 164 lb 9.6 oz (74.7 kg)  04/08/22  174 lb (78.9 kg)   Gen: wd, wn ,nad Skin: unremarkable HEENT: normocephalic, sclerae anicteric, right ear canal with impacted cerumen, left TM normal Neck: supple, no lymphadenopathy, no thyromegaly, no masses, no bruits Heart: RRR, normal S1, S2, no murmurs Lungs: CTA bilaterally, no wheezes, rhonchi, or rales Abdomen: +bs, soft, non tender, non distended, no masses, no hepatomegaly, no splenomegaly Musculoskeletal: bony arthrits changes of PIPs and DIPs of both hands, nontender, no swelling, no obvious deformity Extremities: no edema, no cyanosis, no clubbing Pulses: 2+ symmetric, upper and lower extremities, normal cap refill Neurological: alert, oriented x 3, CN2-12 intact, strength normal upper extremities and lower extremities, sensation normal throughout, DTRs 2+ throughout, no cerebellar signs, gait normal Psychiatric: normal affect, behavior normal, pleasant  Breast /pelvic - declined, deferred  Diabetic Foot Exam - Simple   Simple Foot Form Diabetic Foot exam was performed with the following findings: Yes 06/03/2022  3:02 PM  Visual Inspection No deformities, no ulcerations, no other skin breakdown bilaterally: Yes Sensation Testing Intact to touch and monofilament testing bilaterally: Yes Pulse Check Posterior Tibialis and Dorsalis pulse intact bilaterally: Yes Comments     EKG reviewed     Assessment:   Encounter Diagnoses  Name Primary?   Encounter for health maintenance examination in adult Yes   Type II diabetes mellitus with complication ([place])    Hyperlipidemia associated with type 2 diabetes mellitus ([place])    Primary hypertension    Screening for  heart disease    Need for pneumococcal 20-valent conjugate vaccination    Polyp of colon, unspecified part of colon, unspecified type    Postmenopausal estrogen deficiency    Osteopenia, unspecified location    Impacted cerumen of right ear      Plan:    This visit was a preventative care visit, also known as wellness visit or routine physical.   Topics typically include healthy lifestyle, diet, exercise, preventative care, vaccinations, sick and well care, proper use of emergency dept and after hours care, as well as other concerns.     Recommendations: Continue to return yearly for your annual wellness and preventative care visits.  This gives [place] a chance to discuss healthy lifestyle, exercise, vaccinations, review your chart record, and perform screenings where appropriate.  I recommend you see your eye doctor yearly for routine vision care.  I recommend you see your dentist yearly for routine dental care including hygiene visits twice yearly.   Vaccination recommendations were reviewed Immunization History  Administered Date(s) Administered   Hepatitis B, adult 08/01/2002, 09/10/2002, 02/11/2003   Influenza, Quadrivalent, Recombinant, Inj, Pf 08/01/2017, 07/26/2018, 08/22/2019, 07/25/2020   Influenza-Unspecified 11/15/2012   Moderna Covid-19 Vaccine Bivalent Booster 41yr & up 08/18/2021   PFIZER Comirnaty(Gray Top)Covid-19 Tri-Sucrose Vaccine 02/25/2021   PFIZER(Purple Top)SARS-COV-2 Vaccination 12/10/2019, 12/31/2019, 08/11/2020   PNEUMOCOCCAL CONJUGATE-20 06/03/2022   Pneumococcal Conjugate-13 10/30/2013   Pneumococcal Polysaccharide-23 04/16/2021   Pneumococcal-Unspecified 02/20/2010, 11/15/2010, 12/07/2018   Tdap 08/06/2009, 11/15/2010, 06/11/2021   Zoster Recombinat (Shingrix) 05/30/2021, 08/01/2021   Zoster, Live 03/12/2010, 11/15/2010, 10/27/2015    Counseled on the pneumococcal vaccine.  Vaccine information sheet given.  Pneumococcal vaccine Prevnar 20 given  after consent obtained.  Get a flu shot yearly in the fall   Screening for cancer: Colon cancer screening: Due for repeat colon cancer screening at this time.  Advise she go ahead and get in touch with gastroenterology who has apparently reached out to her by letter  Breast cancer screening: We discussed USPSTF recommendations for breast cancer  screening   Skin cancer screening: Check your skin regularly for new changes, growing lesions, or other lesions of concern Come in for evaluation if you have skin lesions of concern.  Lung cancer screening: If you have a greater than 20 pack year history of tobacco use, then you may qualify for lung cancer screening with a chest CT scan.   Please call your insurance company to inquire about coverage for this test.  We currently don't have screenings for other cancers besides breast, cervical, colon, and lung cancers.  If you have a strong family history of cancer or have other cancer screening concerns, please let me know.    Bone health: Get at least 150 minutes of aerobic exercise weekly Get weight bearing exercise at least once weekly Bone density test:  A bone density test is an imaging test that uses a type of X-ray to measure the amount of calcium and other minerals in your bones. The test may be used to diagnose or screen you for a condition that causes weak or thin bones (osteoporosis), predict your risk for a broken bone (fracture), or determine how well your osteoporosis treatment is working. The bone density test is recommended for females 26 and older, or females or males <57 if certain risk factors such as thyroid disease, long term use of steroids such as for asthma or rheumatological issues, vitamin D deficiency, estrogen deficiency, family history of osteoporosis, self or family history of fragility fracture in first degree relative.  Due for updated bone density test at this time.  Please schedule  Please call to schedule your  bone density test.   [facility]  [phone] [address] [place], [place] [address]   Heart health: Get at least 150 minutes of aerobic exercise weekly Limit alcohol It is important to maintain a healthy blood pressure and healthy cholesterol numbers  Heart disease screening: Screening for heart disease includes screening for blood pressure, fasting lipids, glucose/diabetes screening, BMI height to weight ratio, reviewed of smoking status, physical activity, and diet.    Goals include blood pressure 120/80 or less, maintaining a healthy lipid/cholesterol profile, preventing diabetes or keeping diabetes numbers under good control, not smoking or using tobacco products, exercising most days per week or at least 150 minutes per week of exercise, and eating healthy variety of fruits and vegetables, healthy oils, and avoiding unhealthy food choices like fried food, fast food, high sugar and high cholesterol foods.    Other tests may possibly include EKG test, CT coronary calcium score, echocardiogram, exercise treadmill stress test.   Baseline EKG unremarkable today   Medical care options: I recommend you continue to seek care here first for routine care.  We try really hard to have available appointments Monday through Friday daytime hours for sick visits, acute visits, and physicals.  Urgent care should be used for after hours and weekends for significant issues that cannot wait till the next day.  The emergency department should be used for significant potentially life-threatening emergencies.  The emergency department is expensive, can often have long wait times for less significant concerns, so try to utilize primary care, urgent care, or telemedicine when possible to avoid unnecessary trips to the emergency department.  Virtual visits and telemedicine have been introduced since the pandemic started in 2020, and can be convenient ways to receive  medical care.  We offer virtual appointments as well to assist you in a variety of options to seek medical  care.   Advanced Directives: I recommend you consider completing a [place] and Living Will.   These documents respect your wishes and help alleviate burdens on your loved ones if you were to become terminally ill or be in a position to need those documents enforced.    You can complete Advanced Directives yourself, have them notarized, then have copies made for our office, for you and for anybody you feel should have them in safe keeping.  Or, you can have an attorney prepare these documents.   If you haven't updated your Last Will and Testament in a while, it may be worthwhile having an attorney prepare these documents together and save on some costs.      Significant separate issues Hypertension-at goal, continue current medications  Hyperlipidemia-recent lipid labs from May 2023 at goal, continue statin  Diabetes-follow-up endocrinology soon.  We discussed contact endocrinology sooner if she gets another low reading.  Impacted cerumen on the right-we discussed home remedy.  If not feeling improved in the next week then return at a separate time for ear lavage  I reviewed several labs that she has had done within the past several months.  We will check a CBC today  B[place]a was seen today for fasting cpe.  Diagnoses and all orders for this visit:  Encounter for health maintenance examination in adult -     EKG 12-Lead -     DG Bone Density; Future -     CBC  Type II diabetes mellitus with complication (HCC) -     EKG 12-Lead  Hyperlipidemia associated with type 2 diabetes mellitus ([place])  Primary hypertension -     EKG 12-Lead  Screening for heart disease -     EKG 12-Lead  Need for pneumococcal 20-valent conjugate vaccination -     Pneumococcal conjugate vaccine 20-valent  Polyp of colon, unspecified part of colon, unspecified  type  Postmenopausal estrogen deficiency -     DG Bone Density; Future -     CBC  Osteopenia, unspecified location -     DG Bone Density; Future -     CBC  Impacted cerumen of right ear     Medicare Attestation A preventative services visit was completed today.  During the course of the visit the patient was educated and counseled about appropriate screening and preventive services.  A health risk assessment was established with the patient that included a review of current medications, allergies, social history, family history, medical and preventative health history, biometrics, and preventative screenings to identify potential safety concerns or impairments.  A personalized plan was printed today for the patient's records and use.   Personalized health advice and education was given today to reduce health risks and promote self management and wellness.  Information regarding end of life planning was discussed today.  Dorothea Ogle, PA-C   06/03/2022

## 2022-06-04 NOTE — Patient Instructions (Signed)
This visit was a preventative care visit, also known as wellness visit or routine physical.   Topics typically include healthy lifestyle, diet, exercise, preventative care, vaccinations, sick and well care, proper use of emergency dept and after hours care, as well as other concerns.     Recommendations: Continue to return yearly for your annual wellness and preventative care visits.  This gives [place] a chance to discuss healthy lifestyle, exercise, vaccinations, review your chart record, and perform screenings where appropriate.  I recommend you see your eye doctor yearly for routine vision care.  I recommend you see your dentist yearly for routine dental care including hygiene visits twice yearly.   Vaccination recommendations were reviewed Immunization History  Administered Date(s) Administered   Hepatitis B, adult 08/01/2002, 09/10/2002, 02/11/2003   Influenza, Quadrivalent, Recombinant, Inj, Pf 08/01/2017, 07/26/2018, 08/22/2019, 07/25/2020   Influenza-Unspecified 11/15/2012   Moderna Covid-19 Vaccine Bivalent Booster 13yr & up 08/18/2021   PFIZER Comirnaty(Gray Top)Covid-19 Tri-Sucrose Vaccine 02/25/2021   PFIZER(Purple Top)SARS-COV-2 Vaccination 12/10/2019, 12/31/2019, 08/11/2020   PNEUMOCOCCAL CONJUGATE-20 06/03/2022   Pneumococcal Conjugate-13 10/30/2013   Pneumococcal Polysaccharide-23 04/16/2021   Pneumococcal-Unspecified 02/20/2010, 11/15/2010, 12/07/2018   Tdap 08/06/2009, 11/15/2010, 06/11/2021   Zoster Recombinat (Shingrix) 05/30/2021, 08/01/2021   Zoster, Live 03/12/2010, 11/15/2010, 10/27/2015    Counseled on the pneumococcal vaccine.  Vaccine information sheet given.  Pneumococcal vaccine Prevnar 20 given after consent obtained.  Get a flu shot yearly in the fall   Screening for cancer: Colon cancer screening: Due for repeat colon cancer screening at this time.  Advise she go ahead and get in touch with gastroenterology who has apparently reached out to her by  letter  Breast cancer screening: We discussed USPSTF recommendations for breast cancer screening   Skin cancer screening: Check your skin regularly for new changes, growing lesions, or other lesions of concern Come in for evaluation if you have skin lesions of concern.  Lung cancer screening: If you have a greater than 20 pack year history of tobacco use, then you may qualify for lung cancer screening with a chest CT scan.   Please call your insurance company to inquire about coverage for this test.  We currently don't have screenings for other cancers besides breast, cervical, colon, and lung cancers.  If you have a strong family history of cancer or have other cancer screening concerns, please let me know.    Bone health: Get at least 150 minutes of aerobic exercise weekly Get weight bearing exercise at least once weekly Bone density test:  A bone density test is an imaging test that uses a type of X-ray to measure the amount of calcium and other minerals in your bones. The test may be used to diagnose or screen you for a condition that causes weak or thin bones (osteoporosis), predict your risk for a broken bone (fracture), or determine how well your osteoporosis treatment is working. The bone density test is recommended for females 631and older, or females or males <<35if certain risk factors such as thyroid disease, long term use of steroids such as for asthma or rheumatological issues, vitamin D deficiency, estrogen deficiency, family history of osteoporosis, self or family history of fragility fracture in first degree relative.  Due for updated bone density test at this time.  Please schedule  Please call to schedule your bone density test.   [facility] [phone] [address] [place] [address]  Heart health: Get at least  150 minutes of aerobic exercise weekly Limit alcohol It is important to maintain a healthy blood  pressure and healthy cholesterol numbers  Heart disease screening: Screening for heart disease includes screening for blood pressure, fasting lipids, glucose/diabetes screening, BMI height to weight ratio, reviewed of smoking status, physical activity, and diet.    Goals include blood pressure 120/80 or less, maintaining a healthy lipid/cholesterol profile, preventing diabetes or keeping diabetes numbers under good control, not smoking or using tobacco products, exercising most days per week or at least 150 minutes per week of exercise, and eating healthy variety of fruits and vegetables, healthy oils, and avoiding unhealthy food choices like fried food, fast food, high sugar and high cholesterol foods.    Other tests may possibly include EKG test, CT coronary calcium score, echocardiogram, exercise treadmill stress test.   Baseline EKG unremarkable today   Medical care options: I recommend you continue to seek care here first for routine care.  We try really hard to have available appointments Monday through Friday daytime hours for sick visits, acute visits, and physicals.  Urgent care should be used for after hours and weekends for significant issues that cannot wait till the next day.  The emergency department should be used for significant potentially life-threatening emergencies.  The emergency department is expensive, can often have long wait times for less significant concerns, so try to utilize primary care, urgent care, or telemedicine when possible to avoid unnecessary trips to the emergency department.  Virtual visits and telemedicine have been introduced since the pandemic started in 2020, and can be convenient ways to receive medical care.  We offer virtual appointments as well to assist you in a variety of options to seek medical care.   Advanced Directives: I recommend you consider completing a [place] and Living Will.   These documents respect your wishes and  help alleviate burdens on your loved ones if you were to become terminally ill or be in a position to need those documents enforced.    You can complete Advanced Directives yourself, have them notarized, then have copies made for our office, for you and for anybody you feel should have them in safe keeping.  Or, you can have an attorney prepare these documents.   If you haven't updated your Last Will and Testament in a while, it may be worthwhile having an attorney prepare these documents together and save on some costs.      Significant separate issues Hypertension-at goal, continue current medications  Hyperlipidemia-recent lipid labs from May 2023 at goal, continue statin  Diabetes-follow-up endocrinology soon.  We discussed contact endocrinology sooner if she gets another low reading.  Impacted cerumen on the right-we discussed home remedy.  If not feeling improved in the next week then return at a separate time for ear lavage  I reviewed several labs that she has had done within the past several months.  We will check a CBC toda

## 2022-06-07 ENCOUNTER — Telehealth: Payer: Self-pay

## 2022-06-07 ENCOUNTER — Other Ambulatory Visit: Payer: Self-pay | Admitting: Medical

## 2022-06-07 DIAGNOSIS — Z1231 Encounter for screening mammogram for malignant neoplasm of breast: Secondary | ICD-10-CM

## 2022-06-07 NOTE — Telephone Encounter (Signed)
Pt. Called stating you had mentioned her getting a DEXA scan done. She wanted to know if you could put the order in for that so she can go ahead and get that scheduled.

## 2022-06-10 ENCOUNTER — Ambulatory Visit
Admission: RE | Admit: 2022-06-10 | Discharge: 2022-06-10 | Disposition: A | Discharge status: Home / Self Care | Payer: Medicare PPO | Source: Ambulatory Visit | Attending: Medical | Admitting: Medical

## 2022-06-10 DIAGNOSIS — M810 Age-related osteoporosis without current pathological fracture: Secondary | ICD-10-CM | POA: Diagnosis not present

## 2022-06-10 DIAGNOSIS — Z0000 Encounter for general adult medical examination without abnormal findings: Secondary | ICD-10-CM

## 2022-06-10 DIAGNOSIS — M85852 Other specified disorders of bone density and structure, left thigh: Secondary | ICD-10-CM | POA: Diagnosis not present

## 2022-06-10 DIAGNOSIS — Z780 Asymptomatic menopausal state: Secondary | ICD-10-CM

## 2022-06-10 DIAGNOSIS — M8580 Other specified disorders of bone density and structure, unspecified site: Secondary | ICD-10-CM

## 2022-06-22 ENCOUNTER — Other Ambulatory Visit: Payer: Self-pay | Admitting: Endocrinology

## 2022-06-22 DIAGNOSIS — E1165 Type 2 diabetes mellitus with hyperglycemia: Secondary | ICD-10-CM

## 2022-06-30 ENCOUNTER — Ambulatory Visit: Payer: Medicare PPO | Admitting: Nurse Practitioner

## 2022-06-30 ENCOUNTER — Encounter: Payer: Self-pay | Admitting: Nurse Practitioner

## 2022-06-30 VITALS — BP 132/86 | HR 82 | Ht 64.0 in | Wt 162.0 lb

## 2022-06-30 DIAGNOSIS — Z86010 Personal history of colonic polyps: Secondary | ICD-10-CM | POA: Diagnosis not present

## 2022-06-30 DIAGNOSIS — E118 Type 2 diabetes mellitus with unspecified complications: Secondary | ICD-10-CM

## 2022-06-30 MED ORDER — NA SULFATE-K SULFATE-MG SULF 17.5-3.13-1.6 GM/177ML PO SOLN: 1.0000 | Freq: Once | ORAL | 0 refills | Status: AC

## 2022-06-30 NOTE — Progress Notes (Signed)
06/30/2022 Kelly Jordan [id number] 1946/01/26   Chief Complaint: Schedule a colonoscopy   History of Present Illness: Kelly Jordan is a 76 year old female with a past medical history of hypertension, hyperlipidemia, DM II, osteopenia and colon polyps.  She is followed by Dr. [name] Plan. She presents today to schedule a colonoscopy.  Her most recent colonoscopy was 05/08/2019 which identified 3 tubular adenomatous/sessile serrated polyps measuring 4 to 10 mm which were removed from the ascending and transverse colon, mild diverticulosis to the sigmoid colon and internal hemorrhoids.  She denies having any abdominal pain.  She is passing a normal formed brown bowel movement daily.  No rectal bleeding or black stools.  No family history of colon polyps or colorectal cancer.  She takes ASA 81 mg daily.  No GERD symptoms.  No history of cardiac or pulmonary disease.  She feels her diabetes is well controlled.     Latest Ref Rng & Units 06/03/2022    3:32 PM 04/16/2021    9:59 AM 01/12/2021    3:00 PM  CBC  WBC 3.4 - 10.8 x10E3/uL 7.0  7.5  7.2   Hemoglobin 11.1 - 15.9 g/dL 14.2  14.0  13.5   Hematocrit 34.0 - 46.6 % 41.2  43.1  40.9   Platelets 150 - 450 x10E3/uL 241  234  239        Latest Ref Rng & Units 04/13/2022   11:12 AM 04/08/2022    4:42 PM 04/16/2021    9:59 AM  CMP  Glucose 70 - 99 mg/dL 184  181  171   BUN 6 - 23 mg/dL '16  18  17   '$ Creatinine 0.40 - 1.20 mg/dL 0.90  0.94  0.87   Sodium 135 - 145 mEq/L 136  140  141   Potassium 3.5 - 5.1 mEq/L 3.7  5.3 No hemolysis seen  4.7   Chloride 96 - 112 mEq/L 103  104  102   CO2 19 - 32 mEq/L '25  29  21   '$ Calcium 8.4 - 10.5 mg/dL 9.4  10.2  10.0   Total Protein 6.0 - 8.3 g/dL  7.0  6.9   Total Bilirubin 0.2 - 1.2 mg/dL  0.5  0.4   Alkaline Phos 39 - 117 U/L  77  94   AST 0 - 37 U/L  20  23   ALT 0 - 35 U/L  22  30     Colonoscopy 05/08/2019 with Dr. [name] Plan: - One 10 mm polyp in the ascending colon, removed with a hot snare.  Resected and retrieved. - One 4 mm polyp in the ascending colon, removed with a cold biopsy forceps. Resected and retrieved. - One 7 mm polyp in the transverse colon, removed with a cold snare. Resected and retrieved. - Mild diverticulosis in the sigmoid colon. - Anal papilla(e) were hypertrophied. - Internal hemorrhoids. - The colon otherwise appeared normal on direct and retroflexion views. - 3 year colonoscopy recall - TUBULAR ADENOMA(S). - SESSILE SERRATED POLYP WITHOUT CYTOLOGIC DYSPLASIA. - HIGH GRADE DYSPLASIA IS NOT IDENTIFIED  Colonoscopy 02/07/2014: 3 mm polyp removed from the cecum path report showed benign colonic mucosa.   6 mm sessile serrated adenomatous polyp removed from the ascending colon 3 mm hyperplastic polyp removed from sigmoid colon 4 to 5 mm adenoma/hyperplastic polyp removed from the rectum Moderate diverticulosis to the sigmoid colon Grade 2 internal hemorrhoids   Past Medical History:  Diagnosis Date   Cataract  Diabetes mellitus without complication ([place])    Hyperlipidemia    Hypertension     Past Surgical History:  Procedure Laterality Date   Blefoplasty  Bilateral    BUNIONECTOMY Bilateral    CATARACT EXTRACTION Bilateral 2019   COLONOSCOPY  02/07/2014   POLYPECTOMY     ROTATOR CUFF REPAIR Right    TUBAL LIGATION     Social history: She is widowed.  Former smoker.  Infrequent alcohol use.  Family history: No known family history of esophageal, gastric or colon cancer.  Sister with diabetes.  Current Outpatient Medications on File Prior to Visit  Medication Sig Dispense Refill   aspirin EC 81 MG tablet Take 81 mg by mouth daily.     calcium-vitamin D (OSCAL WITH D) 500-200 MG-UNIT tablet Take 1 tablet by mouth.     glipiZIDE (GLUCOTROL) 5 MG tablet Take 1 tablet before breakfast and 1 tablet before dinner (Patient taking differently: Take 1 tablet before breakfast and 1 tablet before dinner, Has been taking once in the morning) 180  tablet 3   glucose blood (ACCU-CHEK AVIVA PLUS) test strip TEST 2 TIMES A DAY IN VITRO 90 DAYS 200 strip 3   JARDIANCE 25 MG TABS tablet TAKE 1 TABLET BY MOUTH DAILY BEFORE BREAKFAST. 90 tablet 3   metFORMIN (GLUCOPHAGE-XR) 500 MG 24 hr tablet TAKE 4 TABLETS (2,000 MG TOTAL) BY MOUTH DAILY. 360 tablet 3   MOUNJARO 5 MG/0.5ML Pen INJECT 5 MG SUBCUTANEOUSLY WEEKLY 2 mL 0   rosuvastatin (CRESTOR) 10 MG tablet TAKE 1 TABLET BY MOUTH EVERY DAY 90 tablet 0   telmisartan (MICARDIS) 20 MG tablet TAKE 1 TABLET BY MOUTH EVERY DAY 60 tablet 1   No current facility-administered medications on file prior to visit.   No Known Allergies  REVIEW OF SYSTEMS:  Gen: Denies fever, sweats or chills. No weight loss.  CV: Denies chest pain, palpitations or edema. Resp: Denies cough, shortness of breath of hemoptysis.  GI: Denies heartburn, dysphagia, stomach or lower abdominal pain. No diarrhea or constipation.  GU : Denies urinary burning, blood in urine, increased urinary frequency or incontinence. MS: Denies joint pain, muscles aches or weakness. Derm: Denies rash, itchiness, skin lesions or unhealing ulcers. Psych: Denies depression, anxiety or memory issues. Heme: Denies bruising, easy bleeding. Neuro:  Denies headaches, dizziness or paresthesias. Endo:  Denies any problems with DM, thyroid or adrenal function.  Physical Exam: BP 132/86   Pulse 82   Ht '5\' 4"'$  (1.626 m)   Wt 162 lb (73.5 kg)   BMI 27.81 kg/m   General: 76 year old female in no acute distress. Head: Normocephalic and atraumatic. Eyes: No scleral icterus. Conjunctiva pink . Ears: Normal auditory acuity. Mouth: Dentition intact. No ulcers or lesions.  Lungs: Clear throughout to auscultation. Heart: Regular rate and rhythm, no murmur. Abdomen: Soft, nontender and nondistended. No masses or hepatomegaly. Normal bowel sounds x 4 quadrants.  Rectal: Deferred. Musculoskeletal: Symmetrical with no gross deformities. Extremities: No  edema. Neurological: Alert oriented x 4. No focal deficits.  Psychological: Alert and cooperative. Normal mood and affect  Assessment and Recommendations:  44) 76 year old female with a history of colon polyps.  Three tubular adenomatous/sessile serrated polyps measuring 4 - 67m removed from the ascending and transverse colon per colonoscopy 04/2019.  No known family history of colon polyps or colorectal cancer. -Colonoscopy benefits and risks discussed including risk with sedation, risk of bleeding, perforation and infection  -Further recommendations to be determined after colonoscopy completed  2) DM  II

## 2022-06-30 NOTE — Patient Instructions (Signed)
_______________________________________________________  If you are age 78 or older, your body mass index should be between 23-30. Your Body mass index is 27.81 kg/m. If this is out of the aforementioned range listed, please consider follow up with your Primary Care Provider.  If you are age 47 or younger, your body mass index should be between 19-25. Your Body mass index is 27.81 kg/m. If this is out of the aformentioned range listed, please consider follow up with your Primary Care Provider.   You have been scheduled for a colonoscopy. Please follow written instructions given to you at your visit today.  Please pick up your prep supplies at the pharmacy within the next 1-3 days. If you use inhalers (even only as needed), please bring them with you on the day of your procedure.  The [facility] providers would like to encourage you to use [facility] to communicate with providers for non-urgent requests or questions.  Due to long hold times on the telephone, sending your provider a message by [facility] may be a faster and more efficient way to get a response.  Please allow 48 business hours for a response.  Please remember that this is for non-urgent requests.   It was a pleasure to see you today!  Thank you for trusting me with your gastrointestinal care!

## 2022-07-01 ENCOUNTER — Other Ambulatory Visit (INDEPENDENT_AMBULATORY_CARE_PROVIDER_SITE_OTHER): Payer: Medicare PPO

## 2022-07-01 ENCOUNTER — Other Ambulatory Visit: Payer: Self-pay | Admitting: Endocrinology

## 2022-07-01 DIAGNOSIS — E1165 Type 2 diabetes mellitus with hyperglycemia: Secondary | ICD-10-CM | POA: Diagnosis not present

## 2022-07-01 LAB — BASIC METABOLIC PANEL
BUN: 14 mg/dL (ref 6–23)
CO2: 27 mEq/L (ref 19–32)
Calcium: 9.6 mg/dL (ref 8.4–10.5)
Chloride: 106 mEq/L (ref 96–112)
Creatinine, Ser: 0.84 mg/dL (ref 0.40–1.20)
GFR: 66.76 mL/min (ref >60.00)
Glucose, Bld: 125 mg/dL — ABNORMAL HIGH (ref 70–99)
Potassium: 4.6 mEq/L (ref 3.5–5.1)
Sodium: 139 mEq/L (ref 135–145)

## 2022-07-01 LAB — HEMOGLOBIN A1C: Hgb A1c MFr Bld: 6.7 % — ABNORMAL HIGH (ref 4.6–6.5)

## 2022-07-05 ENCOUNTER — Encounter: Payer: Self-pay | Admitting: Endocrinology

## 2022-07-05 ENCOUNTER — Ambulatory Visit: Payer: Medicare PPO | Admitting: Endocrinology

## 2022-07-05 VITALS — BP 122/64 | HR 92 | Ht 64.0 in | Wt 161.8 lb

## 2022-07-05 DIAGNOSIS — E119 Type 2 diabetes mellitus without complications: Secondary | ICD-10-CM

## 2022-07-05 NOTE — Patient Instructions (Addendum)
Check blood sugars on waking up 3 days a week  Also check blood sugars about 2 hours after meals and do this after different meals by rotation  Recommended blood sugar levels on waking up are 90-130 and about 2 hours after meal is 130-160  Please bring your blood sugar monitor to each visit, thank you  Stop Glipizide

## 2022-07-05 NOTE — Progress Notes (Signed)
Patient ID: Kelly Jordan, female   DOB: [birth year], 76 y.o.   MRN: [id number]           Reason for Appointment: Type II Diabetes follow-up   History of Present Illness   Diagnosis date: 2010  Previous history:  Since 2022 her A1c has ranged between 7.4 and 8.8 but only once below 8% Her medication history includes metformin, Prandin, Farxiga in the past Rybelsus was started in 2/22  Recent history:     Non-insulin hypoglycemic drugs: Metformin ER 2 g daily, glipizide 5 mg in a.m., MOUNJARO 5 mg weekly and Jardiance 25 mg daily     Side effects from medications: Abdominal discomfort and heartburn from 14 mg Rybelsus  Current self management, blood sugar patterns and problems identified:  A1c is much better at 6.7 which is the best since 2022 She had been switched from Rybelsus to [facility] after her visit in May and she was able to titrate to 5 mg weekly She has filled her third prescription for [facility] a couple of weeks ago and has no difficulty doing this injection and also taking her regularly Previously had intolerance to higher doses of Rybelsus Although initially she was told to take glipizide twice a day she has stopped taking it in the evening since she would have some low readings in the afternoon recently Even in the last few days she has occasional readings in the afternoons in the 60s FASTING readings are mildly increased but generally in the low 100 range Lab fasting glucose 125 She has lost 13 pounds Currently not able to do much exercise except may be walking once or twice a week but she says she has not been able to leave her home to go for the exercise classes Continues to take metformin and Jardiance  Exercise: Only some walking  Diet management: Not always avoiding sweets like ice cream      Monitors blood glucose: Once a day.    Glucometer: Accu-Chek  Blood Glucose readings from home records as above  Previous readings:  PRE-MEAL Fasting Lunch  Dinner Bedtime Overall  Glucose range: 160-190   ?   Mean/median:         Dietician visit: Most recent: Possibly 2022 but no record available Weight control: 199  Wt Readings from Last 3 Encounters:  07/05/22 161 lb 12.8 oz (73.4 kg)  06/30/22 162 lb (73.5 kg)  06/03/22 163 lb (73.9 kg)            Diabetes labs:  Lab Results  Component Value Date   HGBA1C 6.7 (H) 07/01/2022   HGBA1C 8.6 (A) 04/08/2022   HGBA1C 8.8 (A) 12/17/2021   Lab Results  Component Value Date   MICROALBUR <0.7 04/08/2022   LDLCALC 45 04/08/2022   CREATININE 0.84 07/01/2022     Allergies as of 07/05/2022   No Known Allergies      Medication List        Accurate as of July 05, 2022  9:25 PM. If you have any questions, ask your nurse or doctor.          STOP taking these medications    glipiZIDE 5 MG tablet Commonly known as: GLUCOTROL Stopped by: [name], MD       TAKE these medications    Accu-Chek Aviva Plus test strip Generic drug: glucose blood TEST 2 TIMES A DAY IN VITRO 90 DAYS   aspirin EC 81 MG tablet Take 81 mg by mouth daily.   calcium-vitamin  D 500-200 MG-UNIT tablet Commonly known as: OSCAL WITH D Take 1 tablet by mouth.   Jardiance 25 MG Tabs tablet Generic drug: empagliflozin TAKE 1 TABLET BY MOUTH DAILY BEFORE BREAKFAST.   metFORMIN 500 MG 24 hr tablet Commonly known as: GLUCOPHAGE-XR TAKE 4 TABLETS (2,000 MG TOTAL) BY MOUTH DAILY.   Mounjaro 5 MG/0.5ML Pen Generic drug: tirzepatide INJECT 5 MG SUBCUTANEOUSLY WEEKLY   rosuvastatin 10 MG tablet Commonly known as: CRESTOR TAKE 1 TABLET BY MOUTH EVERY DAY   telmisartan 20 MG tablet Commonly known as: MICARDIS TAKE 1 TABLET BY MOUTH EVERY DAY        Allergies: No Known Allergies  Past Medical History:  Diagnosis Date   Cataract    Diabetes mellitus without complication ([place])    Hyperlipidemia    Hypertension     Past Surgical History:  Procedure Laterality Date   Blefoplasty   Bilateral    BUNIONECTOMY Bilateral    CATARACT EXTRACTION Bilateral 2019   COLONOSCOPY  02/07/2014   POLYPECTOMY     ROTATOR CUFF REPAIR Right    TUBAL LIGATION      Family History  Problem Relation Age of Onset   Diabetes Sister    Colon cancer Neg Hx    Esophageal cancer Neg Hx    Rectal cancer Neg Hx    Stomach cancer Neg Hx    Colon polyps Neg Hx     Social History:  reports that she has quit smoking. She has never used smokeless tobacco. She reports current alcohol use. She reports that she does not use drugs.  Review of Systems:  Last diabetic eye exam date 6/22, no retinopathy  Last foot exam date: 7/22   Hypertension: Mild and managed only with 20 mg of telmisartan  BP Readings from Last 3 Encounters:  07/05/22 122/64  06/30/22 132/86  06/03/22 110/60   Renal function stable  Lab Results  Component Value Date   CREATININE 0.84 07/01/2022   CREATININE 0.90 04/13/2022   CREATININE 0.94 04/08/2022   Hyperkalemia has resolved  Lab Results  Component Value Date   K 4.6 07/01/2022     Hyperlipidemia: She has been treated with Crestor 10 mg by her PCP, most recent labs as follows    Lab Results  Component Value Date   CHOL 115 04/08/2022   CHOL 120 04/16/2021   Lab Results  Component Value Date   HDL 44.40 04/08/2022   HDL 45 04/16/2021   Lab Results  Component Value Date   LDLCALC 45 04/08/2022   LDLCALC 58 04/16/2021   Lab Results  Component Value Date   TRIG 131.0 04/08/2022   TRIG 87 04/16/2021   Lab Results  Component Value Date   CHOLHDL 3 04/08/2022   CHOLHDL 2.7 04/16/2021   No results found for: "LDLDIRECT"   Examination:   BP 122/64   Pulse 92   Ht '5\' 4"'$  (1.626 m)   Wt 161 lb 12.8 oz (73.4 kg)   SpO2 96%   BMI 27.77 kg/m   Body mass index is 27.77 kg/m.    ASSESSMENT/ PLAN:    Diabetes type 2 with mild obesity:   Current regimen: Glipizide once a day, metformin, Jardiance and Mounjaro weekly 5 mg  A1c is now  6.7 compared to 8.6  Blood glucose control which had been consistently poor is now the best she has had in a couple of years with adding Mounjaro  Although she has not done many readings after meals her  blood sugars are usually not high and may be in the low 100 range in the morning from her home diary  She has achieved some weight loss   Plan: Since she is doing well with Mounjaro and weight and blood sugar at target continue 5 mg Stop glipizide Start checking blood sugars consistently after meals and only 3 times a week in the morning  Follow-up in 3 months Patient Instructions  Check blood sugars on waking up 3 days a week  Also check blood sugars about 2 hours after meals and do this after different meals by rotation  Recommended blood sugar levels on waking up are 90-130 and about 2 hours after meal is 130-160  Please bring your blood sugar monitor to each visit, thank you  Stop Glipizide   [name] 07/05/2022, 9:25 PM

## 2022-07-13 ENCOUNTER — Other Ambulatory Visit: Payer: Self-pay | Admitting: Physician Assistant

## 2022-07-21 ENCOUNTER — Encounter: Payer: Self-pay | Admitting: Internal Medicine

## 2022-07-23 ENCOUNTER — Other Ambulatory Visit: Payer: Self-pay | Admitting: Endocrinology

## 2022-07-23 DIAGNOSIS — E1165 Type 2 diabetes mellitus with hyperglycemia: Secondary | ICD-10-CM

## 2022-07-28 ENCOUNTER — Ambulatory Visit: Payer: Medicare PPO

## 2022-08-12 ENCOUNTER — Ambulatory Visit
Admission: RE | Admit: 2022-08-12 | Discharge: 2022-08-12 | Disposition: A | Discharge status: Home / Self Care | Payer: Medicare PPO | Source: Ambulatory Visit | Attending: Medical | Admitting: Medical

## 2022-08-12 DIAGNOSIS — Z1231 Encounter for screening mammogram for malignant neoplasm of breast: Secondary | ICD-10-CM | POA: Diagnosis not present

## 2022-08-20 ENCOUNTER — Encounter: Payer: Medicare PPO | Admitting: Gastroenterology

## 2022-08-20 ENCOUNTER — Other Ambulatory Visit: Payer: Self-pay | Admitting: Endocrinology

## 2022-08-24 ENCOUNTER — Encounter: Payer: Self-pay | Admitting: Internal Medicine

## 2022-08-24 ENCOUNTER — Ambulatory Visit (AMBULATORY_SURGERY_CENTER): Payer: Medicare PPO | Admitting: Gastroenterology

## 2022-08-24 ENCOUNTER — Encounter: Payer: Self-pay | Admitting: Gastroenterology

## 2022-08-24 VITALS — BP 105/59 | HR 79 | Temp 98.6°F | Resp 16

## 2022-08-24 DIAGNOSIS — E119 Type 2 diabetes mellitus without complications: Secondary | ICD-10-CM | POA: Diagnosis not present

## 2022-08-24 DIAGNOSIS — Z09 Encounter for follow-up examination after completed treatment for conditions other than malignant neoplasm: Secondary | ICD-10-CM | POA: Diagnosis not present

## 2022-08-24 DIAGNOSIS — I10 Essential (primary) hypertension: Secondary | ICD-10-CM | POA: Diagnosis not present

## 2022-08-24 DIAGNOSIS — Z860100 Personal history of colon polyps, unspecified: Secondary | ICD-10-CM

## 2022-08-24 MED ORDER — SODIUM CHLORIDE 0.9 % IV SOLN: 500.0000 mL | Freq: Once | INTRAVENOUS | Status: DC

## 2022-08-24 NOTE — Progress Notes (Signed)
To pacu, VSS. Report to [email or web address] 

## 2022-08-24 NOTE — Op Note (Signed)
[facility] Patient Name: Kelly Jordan Procedure Date: 08/24/2022 10:04 AM MRN: [id number] Endoscopist: Ladene Artist , MD Age: 76 Referring MD:  Date of Birth: [birth year] Gender: Female Account #: [id number] Procedure:                Colonoscopy Indications:              Surveillance: Personal history of adenomatous and                            sessile serrated polyps on last colonoscopy 3 years                            ago Medicines:                Monitored Anesthesia Care Procedure:                Pre-Anesthesia Assessment:                           - Prior to the procedure, a History and Physical                            was performed, and patient medications and                            allergies were reviewed. The patient's tolerance of                            previous anesthesia was also reviewed. The risks                            and benefits of the procedure and the sedation                            options and risks were discussed with the patient.                            All questions were answered, and informed consent                            was obtained. Prior Anticoagulants: The patient has                            taken no previous anticoagulant or antiplatelet                            agents. ASA Grade Assessment: II - A patient with                            mild systemic disease. After reviewing the risks                            and benefits, the patient was deemed in  satisfactory condition to undergo the procedure.                           After obtaining informed consent, the colonoscope                            was passed under direct vision. Throughout the                            procedure, the patient's blood pressure, pulse, and                            oxygen saturations were monitored continuously. The                            Olympus CF-HQ190L ([id number]) Colonoscope was                             introduced through the anus and advanced to the the                            cecum, identified by appendiceal orifice and                            ileocecal valve. The ileocecal valve, appendiceal                            orifice, and rectum were photographed. The quality                            of the bowel preparation was good. The colonoscopy                            was performed without difficulty. The patient                            tolerated the procedure well. Scope In: 10:09:53 AM Scope Out: 10:21:57 AM Scope Withdrawal Time: 0 hours 9 minutes 43 seconds  Total Procedure Duration: 0 hours 12 minutes 4 seconds  Findings:                 The perianal and digital rectal examinations were                            normal.                           A few small-mouthed diverticula were found in the                            left colon. There was no evidence of diverticular                            bleeding.  Internal and external hemorrhoids were found during                            retroflexion. The hemorrhoids were small and Grade                            II (internal hemorrhoids that prolapse but reduce                            spontaneously).                           The exam was otherwise without abnormality on                            direct and retroflexion views. Complications:            No immediate complications. Estimated blood loss:                            None. Estimated Blood Loss:     Estimated blood loss: none. Impression:               - Mild diverticulosis in the left colon.                           - Internal and external hemorrhoids.                           - The examination was otherwise normal on direct                            and retroflexion views.                           - No specimens collected. Recommendation:           - Patient has a contact number available for                             emergencies. The signs and symptoms of potential                            delayed complications were discussed with the                            patient. Return to normal activities tomorrow.                            Written discharge instructions were provided to the                            patient.                           - Resume previous diet.                           -  Continue present medications.                           - No repeat colonoscopy planned due to age and the                            absence of colonic polyps. Ladene Artist, MD 08/24/2022 10:26:02 AM This report has been signed electronically.

## 2022-08-24 NOTE — Progress Notes (Signed)
History & Physical  Primary Care Physician:  Carlena Hurl, PA-C Primary Gastroenterologist: Lucio Edward, MD  CHIEF COMPLAINT:  Personal history of colon polyps   HPI: Kelly Jordan is a 76 y.o. female with a personal history of adenomatous and sessile serrated colon polyps for colonoscopy.   Past Medical History:  Diagnosis Date   Cataract    Diabetes mellitus without complication ([place])    Hyperlipidemia    Hypertension     Past Surgical History:  Procedure Laterality Date   Blefoplasty  Bilateral    BUNIONECTOMY Bilateral    CATARACT EXTRACTION Bilateral 2019   COLONOSCOPY  02/07/2014   POLYPECTOMY     ROTATOR CUFF REPAIR Right    TUBAL LIGATION      Prior to Admission medications   Medication Sig Start Date End Date Taking? Authorizing Provider  aspirin EC 81 MG tablet Take 81 mg by mouth daily.   Yes [provider]  glucose blood (ACCU-CHEK AVIVA PLUS) test strip TEST 2 TIMES A DAY IN VITRO 90 DAYS 01/11/22  Yes Renato Shin, MD  JARDIANCE 25 MG TABS tablet TAKE 1 TABLET BY MOUTH DAILY BEFORE BREAKFAST. 04/13/22  Yes [name], MD  metFORMIN (GLUCOPHAGE-XR) 500 MG 24 hr tablet TAKE 4 TABLETS (2,000 MG TOTAL) BY MOUTH DAILY. 02/26/22  Yes Renato Shin, MD  MOUNJARO 5 MG/0.5ML Pen INJECT 5 MG SUBCUTANEOUSLY WEEKLY 07/23/22  Yes [name], MD  rosuvastatin (CRESTOR) 10 MG tablet TAKE 1 TABLET BY MOUTH EVERY DAY 04/13/22  Yes [name], MD  telmisartan (MICARDIS) 20 MG tablet TAKE 1 TABLET BY MOUTH EVERY DAY 07/13/22  Yes Tysinger, Camelia Eng, PA-C  calcium-vitamin D (OSCAL WITH D) 500-200 MG-UNIT tablet Take 1 tablet by mouth.    [provider]    Current Outpatient Medications  Medication Sig Dispense Refill   aspirin EC 81 MG tablet Take 81 mg by mouth daily.     glucose blood (ACCU-CHEK AVIVA PLUS) test strip TEST 2 TIMES A DAY IN VITRO 90 DAYS 200 strip 3   JARDIANCE 25 MG TABS tablet TAKE 1 TABLET BY MOUTH DAILY BEFORE BREAKFAST.  90 tablet 3   metFORMIN (GLUCOPHAGE-XR) 500 MG 24 hr tablet TAKE 4 TABLETS (2,000 MG TOTAL) BY MOUTH DAILY. 360 tablet 3   MOUNJARO 5 MG/0.5ML Pen INJECT 5 MG SUBCUTANEOUSLY WEEKLY 2 mL 1   rosuvastatin (CRESTOR) 10 MG tablet TAKE 1 TABLET BY MOUTH EVERY DAY 90 tablet 0   telmisartan (MICARDIS) 20 MG tablet TAKE 1 TABLET BY MOUTH EVERY DAY 90 tablet 1   calcium-vitamin D (OSCAL WITH D) 500-200 MG-UNIT tablet Take 1 tablet by mouth.     Current Facility-Administered Medications  Medication Dose Route Frequency Provider Last Rate Last Admin   0.9 %  sodium chloride infusion  500 mL Intravenous Once Ladene Artist, MD        Allergies as of 08/24/2022   (No Known Allergies)    Family History  Problem Relation Age of Onset   Diabetes Sister    Colon cancer Neg Hx    Esophageal cancer Neg Hx    Rectal cancer Neg Hx    Stomach cancer Neg Hx    Colon polyps Neg Hx     Social History   Socioeconomic History   Marital status: Widowed    Spouse name: Not on file   Number of children: Not on file   Years of education: Not on file   Highest education level:  Not on file  Occupational History   Not on file  Tobacco Use   Smoking status: Former   Smokeless tobacco: Never   Tobacco comments:        Quit about 14 years ago.   Vaping Use   Vaping Use: Never used  Substance and Sexual Activity   Alcohol use: Yes    Comment: Occasional wine   Drug use: Never   Sexual activity: Not on file  Other Topics Concern   Not on file  Social History Narrative   Lives with son.   Has 3 children, 6 grandchildren.   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  Exercise  - walking and exercise at at [facility].   05/2022   Social Determinants of Health   Financial Resource Strain: Low Risk  (05/28/2022)   Overall Financial Resource Strain (CARDIA)    Difficulty of Paying Living Expenses: Not hard at all  Food Insecurity: No Food Insecurity (05/28/2022)   Hunger Vital Sign    Worried About  Running Out of Food in the Last Year: Never true    Ran Out of Food in the Last Year: Never true  Transportation Needs: No Transportation Needs (05/28/2022)   PRAPARE - Hydrologist (Medical): No    Lack of Transportation (Non-Medical): No  Physical Activity: Sufficiently Active (05/28/2022)   Exercise Vital Sign    Days of Exercise per Week: 3 days    Minutes of Exercise per Session: 60 min  Stress: No Stress Concern Present (05/28/2022)   [place]    Feeling of Stress : Not at all  Social Connections: Not on file  Intimate Partner Violence: Not on file    Review of Systems:  All systems reviewed were negative except where noted in HPI.   Physical Exam: General:  Alert, well-developed, in NAD Head:  Normocephalic and atraumatic. Eyes:  Sclera clear, no icterus.   Conjunctiva pink. Ears:  Normal auditory acuity. Mouth:  No deformity or lesions.  Neck:  Supple; no masses . Lungs:  Clear throughout to auscultation.   No wheezes, crackles, or rhonchi. No acute distress. Heart:  Regular rate and rhythm; no murmurs. Abdomen:  Soft, nondistended, nontender. No masses, hepatomegaly. No obvious masses.  Normal bowel .    Rectal:  Deferred   Msk:  Symmetrical without gross deformities.. Pulses:  Normal pulses noted. Extremities:  Without edema. Neurologic:  Alert and  oriented x4;  grossly normal neurologically. Skin:  Intact without significant lesions or rashes. Cervical Nodes:  No significant cervical adenopathy. Psych:  Alert and cooperative. Normal mood and affect.   Impression / Plan:   Personal history of adenomatous and sessile serrated colon polyps for colonoscopy.  [name]. [name] Plan  08/24/2022, 10:02 AM See Shea Evans, [facility], to contact our on call provider

## 2022-08-24 NOTE — Progress Notes (Signed)
Pt's states no medical or surgical changes since previsit or office visit. 

## 2022-08-24 NOTE — Patient Instructions (Signed)
HANDOUTS PROVIDED ON: DIVERTICULOSIS & HEMORRHOIDS  You may resume your previous diet and medication schedule.  Thank you for allowing [place] to care for you today!!!   YOU HAD AN ENDOSCOPIC PROCEDURE TODAY AT [place]:   Refer to the procedure report that was given to you for any specific questions about what was found during the examination.  If the procedure report does not answer your questions, please call your gastroenterologist to clarify.  If you requested that your care partner not be given the details of your procedure findings, then the procedure report has been included in a sealed envelope for you to review at your convenience later.  YOU SHOULD EXPECT: Some feelings of bloating in the abdomen. Passage of more gas than usual.  Walking can help get rid of the air that was put into your GI tract during the procedure and reduce the bloating. If you had a lower endoscopy (such as a colonoscopy or flexible sigmoidoscopy) you may notice spotting of blood in your stool or on the toilet paper. If you underwent a bowel prep for your procedure, you may not have a normal bowel movement for a few days.  Please Note:  You might notice some irritation and congestion in your nose or some drainage.  This is from the oxygen used during your procedure.  There is no need for concern and it should clear up in a day or so.  SYMPTOMS TO REPORT IMMEDIATELY:  Following lower endoscopy (colonoscopy or flexible sigmoidoscopy):  Excessive amounts of blood in the stool  Significant tenderness or worsening of abdominal pains  Swelling of the abdomen that is new, acute  Fever of 100F or higher  For urgent or emergent issues, a gastroenterologist can be reached at any hour by calling [phone]. Do not use MyChart messaging for urgent concerns.    DIET:  We do recommend a small meal at first, but then you may proceed to your regular diet.  Drink plenty of fluids but you should avoid  alcoholic beverages for 24 hours.  ACTIVITY:  You should plan to take it easy for the rest of today and you should NOT DRIVE or use heavy machinery until tomorrow (because of the sedation medicines used during the test).    FOLLOW UP: Our staff will call the number listed on your records the next business day following your procedure.  We will call around 7:15- 8:00 am to check on you and address any questions or concerns that you may have regarding the information given to you following your procedure. If we do not reach you, we will leave a message.     If any biopsies were taken you will be contacted by phone or by letter within the next 1-3 weeks.  Please call us at [phone] if you have not heard about the biopsies in 3 weeks.    SIGNATURES/CONFIDENTIALITY: You and/or your care partner have signed paperwork which will be entered into your electronic medical record.  These signatures attest to the fact that that the information above on your After Visit Summary has been reviewed and is understood.  Full responsibility of the confidentiality of this discharge information lies with you and/or your care-partner.

## 2022-08-25 ENCOUNTER — Telehealth: Payer: Self-pay | Admitting: Medical

## 2022-08-25 ENCOUNTER — Telehealth: Payer: Self-pay

## 2022-08-25 MED ORDER — ROSUVASTATIN CALCIUM 10 MG PO TABS: 10.0000 mg | ORAL_TABLET | Freq: Every day | ORAL | 0 refills | Status: DC

## 2022-08-25 NOTE — Telephone Encounter (Signed)
done

## 2022-08-25 NOTE — Telephone Encounter (Signed)
  Follow up Call-     08/24/2022    9:42 AM  Call back number  Post procedure Call Back phone  # [phone]  Permission to leave phone message Yes     Patient questions:  Do you have a fever, pain , or abdominal swelling? No. Pain Score  0 *  Have you tolerated food without any problems? Yes.    Have you been able to return to your normal activities? Yes.    Do you have any questions about your discharge instructions: Diet   No. Medications  No. Follow up visit  No.  Do you have questions or concerns about your Care? No.  Actions: * If pain score is 4 or above: No action needed, pain <4.

## 2022-08-25 NOTE — Telephone Encounter (Signed)
Pt called she needs refill on Rosuvastatin 10 mg  [facility]  [facility] but had not received response

## 2022-09-15 ENCOUNTER — Other Ambulatory Visit: Payer: Self-pay | Admitting: Endocrinology

## 2022-09-15 DIAGNOSIS — E1165 Type 2 diabetes mellitus with hyperglycemia: Secondary | ICD-10-CM

## 2022-10-20 ENCOUNTER — Other Ambulatory Visit: Payer: Self-pay | Admitting: Endocrinology

## 2022-10-20 ENCOUNTER — Other Ambulatory Visit (INDEPENDENT_AMBULATORY_CARE_PROVIDER_SITE_OTHER): Payer: Medicare PPO

## 2022-10-20 DIAGNOSIS — E119 Type 2 diabetes mellitus without complications: Secondary | ICD-10-CM | POA: Diagnosis not present

## 2022-10-20 LAB — BASIC METABOLIC PANEL
BUN: 15 mg/dL (ref 6–23)
CO2: 27 mEq/L (ref 19–32)
Calcium: 9.2 mg/dL (ref 8.4–10.5)
Chloride: 106 mEq/L (ref 96–112)
Creatinine, Ser: 0.82 mg/dL (ref 0.40–1.20)
GFR: 68.57 mL/min (ref >60.00)
Glucose, Bld: 112 mg/dL — ABNORMAL HIGH (ref 70–99)
Potassium: 3.9 mEq/L (ref 3.5–5.1)
Sodium: 140 mEq/L (ref 135–145)

## 2022-10-20 LAB — HEMOGLOBIN A1C: Hgb A1c MFr Bld: 6.7 % — ABNORMAL HIGH (ref 4.6–6.5)

## 2022-10-22 ENCOUNTER — Encounter: Payer: Self-pay | Admitting: Endocrinology

## 2022-10-22 ENCOUNTER — Ambulatory Visit: Payer: Medicare PPO | Admitting: Endocrinology

## 2022-10-22 VITALS — BP 120/68 | HR 84 | Ht 64.0 in | Wt 149.6 lb

## 2022-10-22 DIAGNOSIS — E119 Type 2 diabetes mellitus without complications: Secondary | ICD-10-CM | POA: Diagnosis not present

## 2022-10-22 NOTE — Progress Notes (Signed)
Patient ID: Kelly Jordan, female   DOB: 18-May-1946, 76 y.o.   MRN: [id number]           Reason for Appointment: Type II Diabetes follow-up   History of Present Illness   Diagnosis date: 2010  Previous history:  Since 2022 her A1c has ranged between 7.4 and 8.8 but only once below 8% Her medication history includes metformin, Prandin, Farxiga in the past Rybelsus was started in 2/22  Recent history:     Non-insulin hypoglycemic drugs: Metformin ER 2 g daily,  MOUNJARO 5 mg weekly and Jardiance 25 mg daily     Side effects from medications: Abdominal discomfort and heartburn from 14 mg Rybelsus  Current self management, blood sugar patterns and problems identified:  A1c is again 6.7  This is despite stopping glipizide on her last visit  Again has no side effects from [facility] and continues to lose weight with better satiety  She does check her blood sugars at different times and these are generally well-controlled especially in the afternoons and early evenings  She does try to do some walking but not much formal exercise  Overall keeping portions and high-fat foods limited Again renal function is normal with taking Jardiance  Exercise: some walking  Diet management: Not always avoiding sweets like ice cream      Monitors blood glucose: Once a day.    Glucometer: Accu-Chek  Blood Glucose readings from Accu-Chek download: AVERAGE 130 Range 111-186 with only sporadic higher readings after breakfast   Dietician visit: Most recent: Possibly 2022 but no record available Weight control: 199  Wt Readings from Last 3 Encounters:  10/22/22 149 lb 9.6 oz (67.9 kg)  07/05/22 161 lb 12.8 oz (73.4 kg)  06/30/22 162 lb (73.5 kg)            Diabetes labs:  Lab Results  Component Value Date   HGBA1C 6.7 (H) 10/20/2022   HGBA1C 6.7 (H) 07/01/2022   HGBA1C 8.6 (A) 04/08/2022   Lab Results  Component Value Date   MICROALBUR <0.7 04/08/2022   LDLCALC 45 04/08/2022    CREATININE 0.82 10/20/2022     Allergies as of 10/22/2022   No Known Allergies      Medication List        Accurate as of October 22, 2022  1:02 PM. If you have any questions, ask your nurse or doctor.          Accu-Chek Aviva Plus test strip Generic drug: glucose blood TEST 2 TIMES A DAY IN VITRO 90 DAYS   aspirin EC 81 MG tablet Take 81 mg by mouth daily.   calcium-vitamin D 500-200 MG-UNIT tablet Commonly known as: OSCAL WITH D Take 1 tablet by mouth.   Jardiance 25 MG Tabs tablet Generic drug: empagliflozin TAKE 1 TABLET BY MOUTH DAILY BEFORE BREAKFAST.   metFORMIN 500 MG 24 hr tablet Commonly known as: GLUCOPHAGE-XR TAKE 4 TABLETS (2,000 MG TOTAL) BY MOUTH DAILY.   Mounjaro 5 MG/0.5ML Pen Generic drug: tirzepatide INJECT 5 MG SUBCUTANEOUSLY WEEKLY   rosuvastatin 10 MG tablet Commonly known as: CRESTOR Take 1 tablet (10 mg total) by mouth daily.   telmisartan 20 MG tablet Commonly known as: MICARDIS TAKE 1 TABLET BY MOUTH EVERY DAY        Allergies: No Known Allergies  Past Medical History:  Diagnosis Date   Cataract    Diabetes mellitus without complication ([place])    Hyperlipidemia    Hypertension     Past  Surgical History:  Procedure Laterality Date   Blefoplasty  Bilateral    BUNIONECTOMY Bilateral    CATARACT EXTRACTION Bilateral 2019   COLONOSCOPY  02/07/2014   POLYPECTOMY     ROTATOR CUFF REPAIR Right    TUBAL LIGATION      Family History  Problem Relation Age of Onset   Diabetes Sister    Colon cancer Neg Hx    Esophageal cancer Neg Hx    Rectal cancer Neg Hx    Stomach cancer Neg Hx    Colon polyps Neg Hx     Social History:  reports that she has quit smoking. She has never used smokeless tobacco. She reports current alcohol use. She reports that she does not use drugs.  Review of Systems:  Last diabetic eye exam date 6/22, no retinopathy  Last foot exam date: 05/2022   Hypertension: Mild and managed only with 20  mg of telmisartan  BP Readings from Last 3 Encounters:  10/22/22 120/68  08/24/22 (!) 105/59  07/05/22 122/64   Renal function stable  Lab Results  Component Value Date   CREATININE 0.82 10/20/2022   CREATININE 0.84 07/01/2022   CREATININE 0.90 04/13/2022   Hyperkalemia has resolved  Lab Results  Component Value Date   K 3.9 10/20/2022     Hyperlipidemia: She has been treated with Crestor 10 mg by her PCP, most recent labs as follows    Lab Results  Component Value Date   CHOL 115 04/08/2022   CHOL 120 04/16/2021   Lab Results  Component Value Date   HDL 44.40 04/08/2022   HDL 45 04/16/2021   Lab Results  Component Value Date   LDLCALC 45 04/08/2022   LDLCALC 58 04/16/2021   Lab Results  Component Value Date   TRIG 131.0 04/08/2022   TRIG 87 04/16/2021   Lab Results  Component Value Date   CHOLHDL 3 04/08/2022   CHOLHDL 2.7 04/16/2021   No results found for: "LDLDIRECT"   Examination:   BP 120/68   Pulse 84   Ht '5\' 4"'$  (1.626 m)   Wt 149 lb 9.6 oz (67.9 kg)   SpO2 98%   BMI 25.68 kg/m   Body mass index is 25.68 kg/m.    ASSESSMENT/ PLAN:    Diabetes type 2 with mild obesity:   Current regimen:  metformin, Jardiance and Mounjaro weekly 5 mg  A1c is unchanged at 6.7  Blood glucose control is not very consistent with Mounjaro in addition to her metformin and Jardiance Not taking glipizide Although she thinks she would like to reduce her medications further discussed that current medications are working in different ways to help maintain her control and weight loss Tolerating Mounjaro well  She will continue the same regimen Does need regular exercise especially aerobic Follow-up in 4 months  There are no Patient Instructions on file for this visit.   [name] 10/22/2022, 1:02 PM

## 2022-11-06 ENCOUNTER — Other Ambulatory Visit: Payer: Self-pay | Admitting: Endocrinology

## 2022-11-06 DIAGNOSIS — E1165 Type 2 diabetes mellitus with hyperglycemia: Secondary | ICD-10-CM

## 2022-11-19 ENCOUNTER — Other Ambulatory Visit: Payer: Self-pay | Admitting: Medical

## 2022-11-23 ENCOUNTER — Telehealth: Payer: Self-pay | Admitting: Medical

## 2022-11-23 NOTE — Telephone Encounter (Signed)
Pt called  she had her flu vaccine on 07/15/22 at [place] and Covid [employer]) on 09/17/2022 at [place]

## 2022-11-23 NOTE — Telephone Encounter (Signed)
Documented in chart.

## 2022-12-02 ENCOUNTER — Other Ambulatory Visit: Payer: Self-pay | Admitting: Medical

## 2022-12-27 ENCOUNTER — Other Ambulatory Visit: Payer: Self-pay | Admitting: Endocrinology

## 2022-12-27 DIAGNOSIS — E1165 Type 2 diabetes mellitus with hyperglycemia: Secondary | ICD-10-CM

## 2023-01-09 ENCOUNTER — Other Ambulatory Visit: Payer: Self-pay | Admitting: Medical

## 2023-01-10 ENCOUNTER — Other Ambulatory Visit: Payer: Self-pay

## 2023-01-10 DIAGNOSIS — E1165 Type 2 diabetes mellitus with hyperglycemia: Secondary | ICD-10-CM

## 2023-01-10 MED ORDER — METFORMIN HCL ER 500 MG PO TB24
2000.0000 mg | ORAL_TABLET | Freq: Every day | ORAL | 2 refills | Status: DC
  Filled 2023-05-04: qty 360, 90d supply, fill #0
  Filled 2023-08-05: qty 360, 90d supply, fill #1

## 2023-01-10 NOTE — Telephone Encounter (Signed)
Pt has an appt in July

## 2023-02-05 ENCOUNTER — Other Ambulatory Visit: Payer: Self-pay | Admitting: Endocrinology

## 2023-02-05 DIAGNOSIS — E1165 Type 2 diabetes mellitus with hyperglycemia: Secondary | ICD-10-CM

## 2023-02-18 ENCOUNTER — Other Ambulatory Visit (INDEPENDENT_AMBULATORY_CARE_PROVIDER_SITE_OTHER): Payer: Medicare PPO

## 2023-02-18 DIAGNOSIS — E119 Type 2 diabetes mellitus without complications: Secondary | ICD-10-CM | POA: Diagnosis not present

## 2023-02-18 LAB — BASIC METABOLIC PANEL
BUN: 18 mg/dL (ref 6–23)
CO2: 27 mEq/L (ref 19–32)
Calcium: 9.8 mg/dL (ref 8.4–10.5)
Chloride: 105 mEq/L (ref 96–112)
Creatinine, Ser: 0.86 mg/dL (ref 0.40–1.20)
GFR: 64.61 mL/min (ref >60.00)
Glucose, Bld: 118 mg/dL — ABNORMAL HIGH (ref 70–99)
Potassium: 4.8 mEq/L (ref 3.5–5.1)
Sodium: 139 mEq/L (ref 135–145)

## 2023-02-18 LAB — HEMOGLOBIN A1C: Hgb A1c MFr Bld: 6.5 % (ref 4.6–6.5)

## 2023-02-22 ENCOUNTER — Ambulatory Visit: Payer: Medicare PPO | Admitting: Endocrinology

## 2023-02-22 ENCOUNTER — Encounter: Payer: Self-pay | Admitting: Endocrinology

## 2023-02-22 VITALS — BP 122/70 | HR 90 | Ht 64.0 in | Wt 148.4 lb

## 2023-02-22 DIAGNOSIS — E119 Type 2 diabetes mellitus without complications: Secondary | ICD-10-CM

## 2023-02-22 NOTE — Progress Notes (Signed)
Patient ID: Kelly Jordan, female   DOB: 01-Jan-1946, 77 y.o.   MRN: [id number]           Reason for Appointment: Type II Diabetes follow-up   History of Present Illness   Diagnosis date: 2010  Previous history:  Since 2022 her A1c has ranged between 7.4 and 8.8 but only once below 8% Her medication history includes metformin, Prandin, Farxiga in the past Rybelsus was started in 2/22  Recent history:     Non-insulin hypoglycemic drugs: Metformin ER 2 g daily,  MOUNJARO 5 mg weekly and Jardiance 25 mg daily     Side effects from medications: Abdominal discomfort and heartburn from 14 mg Rybelsus  Current self management, blood sugar patterns and problems identified:  A1c is 6.5 compared to 6.7  She has been able to continue Mounjaro regularly along with Jardiance and has no side effects from either Only recently starting to do a little more walking for exercise and tends to be more active with warmer weather Previously had lost significant weight and has been able to maintain this Also usually trying to eat healthy meals Lately has been checking only fasting readings and forgets to check readings after meals Mounjaro dose has been continued at 5 mg which she takes regularly  Diet management: Not always avoiding sweets like ice cream      Monitors blood glucose: Once a day.    Glucometer: Accu-Chek  Blood Glucose readings from Accu-Chek review:  Range 96-126 recently with 30-day average 112  AVERAGE 130 previously Range 111-186 with only sporadic higher readings after breakfast  Dietician visit: Most recent: Possibly 2022 but no record available Weight control: 199  Wt Readings from Last 3 Encounters:  02/22/23 148 lb 6.4 oz (67.3 kg)  10/22/22 149 lb 9.6 oz (67.9 kg)  07/05/22 161 lb 12.8 oz (73.4 kg)            Diabetes labs:  Lab Results  Component Value Date   HGBA1C 6.5 02/18/2023   HGBA1C 6.7 (H) 10/20/2022   HGBA1C 6.7 (H) 07/01/2022   Lab  Results  Component Value Date   MICROALBUR <0.7 04/08/2022   LDLCALC 45 04/08/2022   CREATININE 0.86 02/18/2023     Allergies as of 02/22/2023   No Known Allergies      Medication List        Accurate as of February 22, 2023  9:41 AM. If you have any questions, ask your nurse or doctor.          Accu-Chek Aviva Plus test strip Generic drug: glucose blood TEST 2 TIMES A DAY IN VITRO 90 DAYS   aspirin EC 81 MG tablet Take 81 mg by mouth daily.   calcium-vitamin D 500-200 MG-UNIT tablet Commonly known as: OSCAL WITH D Take 1 tablet by mouth.   Jardiance 25 MG Tabs tablet Generic drug: empagliflozin TAKE 1 TABLET BY MOUTH DAILY BEFORE BREAKFAST.   metFORMIN 500 MG 24 hr tablet Commonly known as: GLUCOPHAGE-XR Take 4 tablets (2,000 mg total) by mouth daily.   Mounjaro 5 MG/0.5ML Pen Generic drug: tirzepatide INJECT 5 MG SUBCUTANEOUSLY WEEKLY   rosuvastatin 10 MG tablet Commonly known as: CRESTOR TAKE 1 TABLET BY MOUTH EVERY DAY   telmisartan 20 MG tablet Commonly known as: MICARDIS TAKE 1 TABLET BY MOUTH EVERY DAY        Allergies: No Known Allergies  Past Medical History:  Diagnosis Date   Cataract    Diabetes mellitus without complication  Hyperlipidemia    Hypertension     Past Surgical History:  Procedure Laterality Date   Blefoplasty  Bilateral    BUNIONECTOMY Bilateral    CATARACT EXTRACTION Bilateral 2019   COLONOSCOPY  02/07/2014   POLYPECTOMY     ROTATOR CUFF REPAIR Right    TUBAL LIGATION      Family History  Problem Relation Age of Onset   Diabetes Sister    Colon cancer Neg Hx    Esophageal cancer Neg Hx    Rectal cancer Neg Hx    Stomach cancer Neg Hx    Colon polyps Neg Hx     Social History:  reports that she has quit smoking. She has never used smokeless tobacco. She reports current alcohol use. She reports that she does not use drugs.  Review of Systems:  Last diabetic eye exam date 7/23, no retinopathy  Last foot  exam date: 05/2022  Hypertension: Mild and managed only with 20 mg of telmisartan  BP Readings from Last 3 Encounters:  02/22/23 122/70  10/22/22 120/68  08/24/22 (!) 105/59   Renal function stable  Lab Results  Component Value Date   CREATININE 0.86 02/18/2023   CREATININE 0.82 10/20/2022   CREATININE 0.84 07/01/2022    Hyperlipidemia: She has been treated with Crestor 10 mg by her PCP, most recent labs as follows    Lab Results  Component Value Date   CHOL 115 04/08/2022   CHOL 120 04/16/2021   Lab Results  Component Value Date   HDL 44.40 04/08/2022   HDL 45 04/16/2021   Lab Results  Component Value Date   LDLCALC 45 04/08/2022   LDLCALC 58 04/16/2021   Lab Results  Component Value Date   TRIG 131.0 04/08/2022   TRIG 87 04/16/2021   Lab Results  Component Value Date   CHOLHDL 3 04/08/2022   CHOLHDL 2.7 04/16/2021   No results found for: "LDLDIRECT"   Examination:   BP 122/70 (BP Location: Left Arm, Patient Position: Sitting, Cuff Size: Normal)   Pulse 90   Ht 5\' 4"  (1.626 m)   Wt 148 lb 6.4 oz (67.3 kg)   SpO2 95%   BMI 25.47 kg/m   Body mass index is 25.47 kg/m.    ASSESSMENT/ PLAN:    Diabetes type 2 with mild obesity:   Current regimen:  metformin, Jardiance and Mounjaro weekly 5 mg  A1c is slightly better at 6.5  Blood glucose control has been very consistent with Mounjaro in addition to her metformin and Jardiance As above her maintain her weight loss and is starting to do more activity for exercise  She will continue the same regimen Reminded her to check more readings after meals Follow-up in 4 months  There are no Patient Instructions on file for this visit.   Reather Littler 02/22/2023, 9:41 AM

## 2023-02-22 NOTE — Patient Instructions (Signed)
Check blood sugars on waking up 2-3 days a week  Also check blood sugars about 2 hours after meals and do this after different meals by rotation  Recommended blood sugar levels on waking up are 90-130 and about 2 hours after meal is 130-160  Please bring your blood sugar monitor to each visit, thank you   

## 2023-03-11 ENCOUNTER — Other Ambulatory Visit (HOSPITAL_COMMUNITY): Payer: Self-pay

## 2023-03-11 ENCOUNTER — Other Ambulatory Visit: Payer: Self-pay | Admitting: Endocrinology

## 2023-03-11 DIAGNOSIS — E1165 Type 2 diabetes mellitus with hyperglycemia: Secondary | ICD-10-CM

## 2023-03-11 MED ORDER — TRULICITY 1.5 MG/0.5ML ~~LOC~~ SOAJ
1.5000 mg | SUBCUTANEOUS | 3 refills | Status: DC
  Filled 2023-03-11: qty 2, 28d supply, fill #0

## 2023-03-30 ENCOUNTER — Encounter: Payer: Self-pay | Admitting: Endocrinology

## 2023-03-31 ENCOUNTER — Encounter: Payer: Self-pay | Admitting: Endocrinology

## 2023-03-31 ENCOUNTER — Other Ambulatory Visit (HOSPITAL_COMMUNITY): Payer: Self-pay

## 2023-04-01 ENCOUNTER — Other Ambulatory Visit: Payer: Self-pay

## 2023-04-01 ENCOUNTER — Other Ambulatory Visit (HOSPITAL_COMMUNITY): Payer: Self-pay

## 2023-04-01 DIAGNOSIS — E1165 Type 2 diabetes mellitus with hyperglycemia: Secondary | ICD-10-CM

## 2023-04-01 MED ORDER — MOUNJARO 5 MG/0.5ML ~~LOC~~ SOAJ
5.0000 mg | SUBCUTANEOUS | 1 refills | Status: DC
Start: 2023-04-01 — End: 2023-04-29
  Filled 2023-04-01: qty 2, 28d supply, fill #0

## 2023-04-03 ENCOUNTER — Other Ambulatory Visit: Payer: Self-pay

## 2023-04-03 DIAGNOSIS — E1165 Type 2 diabetes mellitus with hyperglycemia: Secondary | ICD-10-CM

## 2023-04-03 MED ORDER — TIRZEPATIDE 5 MG/0.5ML ~~LOC~~ SOAJ
5.0000 mg | SUBCUTANEOUS | 1 refills | Status: DC
  Filled 2023-04-03 – 2023-05-02 (×3): qty 2, 28d supply, fill #0
  Filled 2023-05-23 – 2023-06-17 (×2): qty 2, 28d supply, fill #1
  Filled 2023-07-10: qty 2, 28d supply, fill #2
  Filled 2023-08-12: qty 2, 28d supply, fill #3
  Filled 2023-09-05: qty 2, 28d supply, fill #4
  Filled 2023-10-07: qty 2, 28d supply, fill #5

## 2023-04-04 ENCOUNTER — Other Ambulatory Visit (HOSPITAL_COMMUNITY): Payer: Self-pay

## 2023-04-29 ENCOUNTER — Other Ambulatory Visit: Payer: Self-pay | Admitting: Endocrinology

## 2023-04-29 DIAGNOSIS — E1165 Type 2 diabetes mellitus with hyperglycemia: Secondary | ICD-10-CM

## 2023-05-02 ENCOUNTER — Other Ambulatory Visit (HOSPITAL_COMMUNITY): Payer: Self-pay

## 2023-05-04 ENCOUNTER — Other Ambulatory Visit (HOSPITAL_COMMUNITY): Payer: Self-pay

## 2023-05-04 MED FILL — Rosuvastatin Calcium Tab 10 MG: ORAL | 90 days supply | Qty: 90 | Fill #0 | Status: AC

## 2023-05-04 MED FILL — Telmisartan Tab 20 MG: ORAL | 90 days supply | Qty: 90 | Fill #0 | Status: AC

## 2023-05-07 ENCOUNTER — Other Ambulatory Visit: Payer: Self-pay | Admitting: Endocrinology

## 2023-05-18 ENCOUNTER — Other Ambulatory Visit (HOSPITAL_COMMUNITY): Payer: Self-pay

## 2023-05-18 MED FILL — Empagliflozin Tab 25 MG: ORAL | 90 days supply | Qty: 90 | Fill #0 | Status: AC

## 2023-05-20 ENCOUNTER — Other Ambulatory Visit (HOSPITAL_COMMUNITY): Payer: Self-pay

## 2023-05-23 ENCOUNTER — Other Ambulatory Visit (HOSPITAL_COMMUNITY): Payer: Self-pay

## 2023-05-24 ENCOUNTER — Ambulatory Visit (INDEPENDENT_AMBULATORY_CARE_PROVIDER_SITE_OTHER): Payer: Medicare PPO

## 2023-05-24 VITALS — BP 130/66 | HR 86 | Temp 97.7°F | Ht 65.5 in | Wt 146.0 lb

## 2023-05-24 DIAGNOSIS — Z0000 Encounter for general adult medical examination without abnormal findings: Secondary | ICD-10-CM

## 2023-05-24 NOTE — Progress Notes (Signed)
Subjective:   Kelly Jordan is a 77 y.o. female who presents for Medicare Annual (Subsequent) preventive examination.  Visit Complete: In person    Review of Systems     Cardiac Risk Factors include: advanced age (>49men, >54 women);diabetes mellitus;hypertension     Objective:    Today's Vitals   05/24/23 1440  BP: 130/66  Pulse: 86  Temp: 97.7 F (36.5 C)  TempSrc: Oral  SpO2: 96%  Weight: 146 lb (66.2 kg)  Height: 5' 5.5" (1.664 m)   Body mass index is 23.93 kg/m.     05/24/2023    2:47 PM 05/28/2022   10:06 AM  Advanced Directives  Does Patient Have a Medical Advance Directive? Yes Yes  Type of Estate agent of [place];Living will Healthcare Power of [place];Living will  Copy of Healthcare Power of Attorney in Chart? Yes - validated most recent copy scanned in chart (See row information) Yes - validated most recent copy scanned in chart (See row information)    Current Medications (verified) Outpatient Encounter Medications as of 05/24/2023  Medication Sig   aspirin EC 81 MG tablet Take 81 mg by mouth daily.   calcium-vitamin D (OSCAL WITH D) 500-200 MG-UNIT tablet Take 1 tablet by mouth.   empagliflozin (JARDIANCE) 25 MG TABS tablet Take 1 tablet (25 mg total) by mouth daily before breakfast   glucose blood (ACCU-CHEK AVIVA PLUS) test strip TEST 2 TIMES A DAY IN VITRO 90 DAYS   metFORMIN (GLUCOPHAGE-XR) 500 MG 24 hr tablet Take 4 tablets (2,000 mg total) by mouth daily.   rosuvastatin (CRESTOR) 10 MG tablet TAKE 1 TABLET BY MOUTH EVERY DAY   telmisartan (MICARDIS) 20 MG tablet Take 1 tablet (20 mg total) by mouth daily.   tirzepatide [facility]) 5 MG/0.5ML Pen Inject 5 mg into the skin once a week.   tirzepatide [facility]) 5 MG/0.5ML Pen INJECT 5 MG SUBCUTANEOUSLY WEEKLY (Patient not taking: Reported on 05/24/2023)   Facility-Administered Encounter Medications as of 05/24/2023  Medication   0.9 %  sodium chloride infusion     Allergies (verified) Patient has no known allergies.   History: Past Medical History:  Diagnosis Date   Cataract    Diabetes mellitus without complication (HCC)    Hyperlipidemia    Hypertension    Past Surgical History:  Procedure Laterality Date   Blefoplasty  Bilateral    BUNIONECTOMY Bilateral    CATARACT EXTRACTION Bilateral 2019   COLONOSCOPY  02/07/2014   POLYPECTOMY     ROTATOR CUFF REPAIR Right    TUBAL LIGATION     Family History  Problem Relation Age of Onset   Diabetes Sister    Colon cancer Neg Hx    Esophageal cancer Neg Hx    Rectal cancer Neg Hx    Stomach cancer Neg Hx    Colon polyps Neg Hx    Social History   Socioeconomic History   Marital status: Widowed    Spouse name: Not on file   Number of children: Not on file   Years of education: Not on file   Highest education level: Not on file  Occupational History   Not on file  Tobacco Use   Smoking status: Former   Smokeless tobacco: Never   Tobacco comments:        Quit about 14 years ago.   Vaping Use   Vaping Use: Never used  Substance and Sexual Activity   Alcohol use: Yes    Comment: Occasional wine  Drug use: Never   Sexual activity: Not on file  Other Topics Concern   Not on file  Social History Narrative   Lives with son.   Has 3 children, 6 grandchildren.   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  Exercise  - walking and exercise at at [facility].   05/2022   Social Determinants of Health   Financial Resource Strain: Low Risk  (05/24/2023)   Overall Financial Resource Strain (CARDIA)    Difficulty of Paying Living Expenses: Not hard at all  Food Insecurity: No Food Insecurity (05/24/2023)   Hunger Vital Sign    Worried About Running Out of Food in the Last Year: Never true    Ran Out of Food in the Last Year: Never true  Transportation Needs: No Transportation Needs (05/24/2023)   PRAPARE - Administrator, Civil Service (Medical): No    Lack of  Transportation (Non-Medical): No  Physical Activity: Insufficiently Active (05/24/2023)   Exercise Vital Sign    Days of Exercise per Week: 2 days    Minutes of Exercise per Session: 60 min  Stress: No Stress Concern Present (05/24/2023)   [employer] of Occupational Health - Occupational Stress Questionnaire    Feeling of Stress : Not at all  Social Connections: Moderately Integrated (05/24/2023)   Social Connection and Isolation Panel [NHANES]    Frequency of Communication with Friends and Family: More than three times a week    Frequency of Social Gatherings with Friends and Family: More than three times a week    Attends Religious Services: More than 4 times per year    Active Member of [employer] or Organizations: Yes    Attends Banker Meetings: Never    Marital Status: Widowed    Tobacco Counseling Counseling given: Not Answered Tobacco comments:  Quit about 14 years ago.    Clinical Intake:  Pre-visit preparation completed: Yes  Pain : No/denies pain     Nutritional Status: BMI of 19-24  Normal Nutritional Risks: None Diabetes: Yes CBG done?: No Did pt. bring in CBG monitor from home?: No  How often do you need to have someone help you when you read instructions, pamphlets, or other written materials from your doctor or pharmacy?: 1 - Never  Interpreter Needed?: No  Information entered by :: NAllen LPN   Activities of Daily Living    05/24/2023    2:42 PM 05/28/2022   10:09 AM  In your present state of health, do you have any difficulty performing the following activities:  Hearing? 0 0  Vision? 0 0  Difficulty concentrating or making decisions? 0 0  Walking or climbing stairs? 0 0  Dressing or bathing? 0 0  Doing errands, shopping? 0 0  Preparing Food and eating ? N N  Using the Toilet? N N  In the past six months, have you accidently leaked urine? Y Y  Comment ocassionally at night getting up going to bathroom held a little too long  Do you  have problems with loss of bowel control? N N  Managing your Medications? N N  Managing your Finances? N N  Housekeeping or managing your Housekeeping? N N    Patient Care Team: Tysinger, Kermit Balo, PA-C as PCP - General (Family Medicine)  Indicate any recent Medical Services you may have received from other than [facility] providers in the past year (date may be approximate).     Assessment:   This is a routine  wellness examination for [place].  Hearing/Vision screen Hearing Screening - Comments:: Denies hearing issues Vision Screening - Comments:: Regular eye exams, Dr. Maris Berger  Dietary issues and exercise activities discussed:     Goals Addressed             This Visit's Progress    Patient Stated       05/24/2023, stay active       Depression Screen    05/24/2023    2:49 PM 05/28/2022   10:07 AM 04/16/2021    8:40 AM 01/12/2021    1:43 PM  PHQ 2/9 Scores  PHQ - 2 Score 0 0 0 0  PHQ- 9 Score 0 0      Fall Risk    05/24/2023    2:48 PM 05/28/2022   10:07 AM 04/16/2021    8:40 AM 01/12/2021    1:43 PM  Fall Risk   Falls in the past year? 0 0 0 0  Number falls in past yr: 0 0 0 0  Injury with Fall? 0 0 0 0  Risk for fall due to : Medication side effect Medication side effect No Fall Risks No Fall Risks  Follow up Falls prevention discussed;Falls evaluation completed Falls evaluation completed;Education provided;Falls prevention discussed Falls evaluation completed Falls evaluation completed    MEDICARE RISK AT HOME:  Medicare Risk at Home - 05/24/23 1448     Any stairs in or around the home? Yes    If so, are there any without handrails? No    Home free of loose throw rugs in walkways, pet beds, electrical cords, etc? Yes    Adequate lighting in your home to reduce risk of falls? Yes    Life alert? No    Use of a cane, walker or w/c? No    Grab bars in the bathroom? Yes    Shower chair or bench in shower? Yes    Elevated toilet seat or a handicapped toilet?  Yes             TIMED UP AND GO:  Was the test performed?  Yes  Length of time to ambulate 10 feet: 5 sec Gait steady and fast without use of assistive device    Cognitive Function:        05/24/2023    2:49 PM 05/28/2022   10:10 AM  6CIT Screen  What Year? 0 points 0 points  What month? 0 points 0 points  What time? 0 points 0 points  Count back from 20 0 points 0 points  Months in reverse 2 points 0 points  Repeat phrase 0 points 2 points  Total Score 2 points 2 points    Immunizations Immunization History  Administered Date(s) Administered   COVID-19, mRNA, vaccine(Comirnaty)12 years and older 09/17/2022   Hepatitis B 08/01/2002, 09/10/2002, 02/11/2003   Hepatitis B, ADULT 08/01/2002, 09/10/2002, 02/11/2003   Influenza, Quadrivalent, Recombinant, Inj, Pf 08/01/2017, 07/26/2018, 08/22/2019, 07/25/2020   Influenza-Unspecified 11/15/2012, 07/15/2022   Moderna Covid-19 Vaccine Bivalent Booster 39yrs & up 08/18/2021   PFIZER Comirnaty(Gray Top)Covid-19 Tri-Sucrose Vaccine 02/25/2021   PFIZER(Purple Top)SARS-COV-2 Vaccination 12/10/2019, 12/31/2019, 08/11/2020   PNEUMOCOCCAL CONJUGATE-20 06/03/2022   Pneumococcal Conjugate-13 10/30/2013   Pneumococcal Polysaccharide-23 02/20/2010, 04/16/2021   Pneumococcal-Unspecified 02/20/2010, 11/15/2010, 12/07/2018   Tdap 08/06/2009, 11/15/2010, 06/11/2021   Zoster Recombinant(Shingrix) 05/30/2021, 08/01/2021   Zoster, Live 03/12/2010, 11/15/2010, 10/27/2015    TDAP status: Up to date  Flu Vaccine status: Up to date  Pneumococcal vaccine status: Up to date  Covid-19 vaccine status: Completed vaccines  Qualifies for Shingles Vaccine? Yes   Zostavax completed Yes   Shingrix Completed?: Yes  Screening Tests Health Maintenance  Topic Date Due   COVID-19 Vaccine (7 - 2023-24 season) 11/12/2022   Diabetic kidney evaluation - Urine ACR  04/09/2023   OPHTHALMOLOGY EXAM  06/02/2023   FOOT EXAM  06/04/2023   INFLUENZA VACCINE   06/16/2023   HEMOGLOBIN A1C  08/20/2023   Diabetic kidney evaluation - eGFR measurement  02/18/2024   Medicare Annual Wellness (AWV)  05/23/2024   DTaP/Tdap/Td (4 - Td or Tdap) 06/12/2031   Pneumonia Vaccine 30+ Years old  Completed   DEXA SCAN  Completed   Hepatitis C Screening  Completed   Zoster Vaccines- Shingrix  Completed   HPV VACCINES  Aged Out   Colonoscopy  Discontinued    Health Maintenance  Health Maintenance Due  Topic Date Due   COVID-19 Vaccine (7 - 2023-24 season) 11/12/2022   Diabetic kidney evaluation - Urine ACR  04/09/2023    Colorectal cancer screening: No longer required.   Mammogram status: Completed 08/12/2022. Repeat every year  Bone Density status: Completed 06/10/2022.   Lung Cancer Screening: (Low Dose CT Chest recommended if Age 70-80 years, 20 pack-year currently smoking OR have quit w/in 15years.) does not qualify.   Lung Cancer Screening Referral: no  Additional Screening:  Hepatitis C Screening: does qualify; Completed 04/16/2021  Vision Screening: Recommended annual ophthalmology exams for early detection of glaucoma and other disorders of the eye. Is the patient up to date with their annual eye exam?  Yes  Who is the provider or what is the name of the office in which the patient attends annual eye exams? Dr. Charlotte Sanes If pt is not established with a provider, would they like to be referred to a provider to establish care? No .   Dental Screening: Recommended annual dental exams for proper oral hygiene  Diabetic Foot Exam: Diabetic Foot Exam: Completed 06/03/2022  Community Resource Referral / Chronic Care Management: CRR required this visit?  No   CCM required this visit?  No     Plan:     I have personally reviewed and noted the following in the patient's chart:   Medical and social history Use of alcohol, tobacco or illicit drugs  Current medications and supplements including opioid prescriptions. Patient is not currently taking  opioid prescriptions. Functional ability and status Nutritional status Physical activity Advanced directives List of other physicians Hospitalizations, surgeries, and ER visits in previous 12 months Vitals Screenings to include cognitive, depression, and falls Referrals and appointments  In addition, I have reviewed and discussed with patient certain preventive protocols, quality metrics, and best practice recommendations. A written personalized care plan for preventive services as well as general preventive health recommendations were provided to patient.     Barb Merino, LPN   04/17/1307   After Visit Summary: in person  Nurse Notes: none

## 2023-05-24 NOTE — Patient Instructions (Addendum)
Kelly Jordan , Thank you for taking time to come for your Medicare Wellness Visit. I appreciate your ongoing commitment to your health goals. Please review the following plan we discussed and let me know if I can assist you in the future.   These are the goals we discussed:  Goals      Patient Stated     05/28/2022, do better with blood sugars and eat healthy     Patient Stated     05/24/2023, stay active        This is a list of the screening recommended for you and due dates:  Health Maintenance  Topic Date Due   COVID-19 Vaccine (7 - 2023-24 season) 11/12/2022   Yearly kidney health urinalysis for diabetes  04/09/2023   Eye exam for diabetics  06/02/2023   Complete foot exam   06/04/2023   Flu Shot  06/16/2023   Hemoglobin A1C  08/20/2023   Yearly kidney function blood test for diabetes  02/18/2024   Medicare Annual Wellness Visit  05/23/2024   DTaP/Tdap/Td vaccine (4 - Td or Tdap) 06/12/2031   Pneumonia Vaccine  Completed   DEXA scan (bone density measurement)  Completed   Hepatitis C Screening  Completed   Zoster (Shingles) Vaccine  Completed   HPV Vaccine  Aged Out   Colon Cancer Screening  Discontinued    Advanced directives: copy in chart  Conditions/risks identified: none  Next appointment: Follow up in one year for your annual wellness visit    Preventive Care 65 Years and Older, Female Preventive care refers to lifestyle choices and visits with your health care provider that can promote health and wellness. What does preventive care include? A yearly physical exam. This is also called an annual well check. Dental exams once or twice a year. Routine eye exams. Ask your health care provider how often you should have your eyes checked. Personal lifestyle choices, including: Daily care of your teeth and gums. Regular physical activity. Eating a healthy diet. Avoiding tobacco and drug use. Limiting alcohol use. Practicing safe sex. Taking low-dose aspirin every  day. Taking vitamin and mineral supplements as recommended by your health care provider. What happens during an annual well check? The services and screenings done by your health care provider during your annual well check will depend on your age, overall health, lifestyle risk factors, and family history of disease. Counseling  Your health care provider may ask you questions about your: Alcohol use. Tobacco use. Drug use. Emotional well-being. Home and relationship well-being. Sexual activity. Eating habits. History of falls. Memory and ability to understand (cognition). Work and work Astronomer. Reproductive health. Screening  You may have the following tests or measurements: Height, weight, and BMI. Blood pressure. Lipid and cholesterol levels. These may be checked every 5 years, or more frequently if you are over 40 years old. Skin check. Lung cancer screening. You may have this screening every year starting at age 80 if you have a 30-pack-year history of smoking and currently smoke or have quit within the past 15 years. Fecal occult blood test (FOBT) of the stool. You may have this test every year starting at age 60. Flexible sigmoidoscopy or colonoscopy. You may have a sigmoidoscopy every 5 years or a colonoscopy every 10 years starting at age 50. Hepatitis C blood test. Hepatitis B blood test. Sexually transmitted disease (STD) testing. Diabetes screening. This is done by checking your blood sugar (glucose) after you have not eaten for a while (fasting). You  may have this done every 1-3 years. Bone density scan. This is done to screen for osteoporosis. You may have this done starting at age 6. Mammogram. This may be done every 1-2 years. Talk to your health care provider about how often you should have regular mammograms. Talk with your health care provider about your test results, treatment options, and if necessary, the need for more tests. Vaccines  Your health care  provider may recommend certain vaccines, such as: Influenza vaccine. This is recommended every year. Tetanus, diphtheria, and acellular pertussis (Tdap, Td) vaccine. You may need a Td booster every 10 years. Zoster vaccine. You may need this after age 60. Pneumococcal 13-valent conjugate (PCV13) vaccine. One dose is recommended after age 64. Pneumococcal polysaccharide (PPSV23) vaccine. One dose is recommended after age 83. Talk to your health care provider about which screenings and vaccines you need and how often you need them. This information is not intended to replace advice given to you by your health care provider. Make sure you discuss any questions you have with your health care provider. Document Released: 11/28/2015 Document Revised: 07/21/2016 Document Reviewed: 09/02/2015 Elsevier Interactive Patient Education  2017 [employer].  Fall Prevention in the Home Falls can cause injuries. They can happen to people of all ages. There are many things you can do to make your home safe and to help prevent falls. What can I do on the outside of my home? Regularly fix the edges of walkways and driveways and fix any cracks. Remove anything that might make you trip as you walk through a door, such as a raised step or threshold. Trim any bushes or trees on the path to your home. Use bright outdoor lighting. Clear any walking paths of anything that might make someone trip, such as rocks or tools. Regularly check to see if handrails are loose or broken. Make sure that both sides of any steps have handrails. Any raised decks and porches should have guardrails on the edges. Have any leaves, snow, or ice cleared regularly. Use sand or salt on walking paths during winter. Clean up any spills in your garage right away. This includes oil or grease spills. What can I do in the bathroom? Use night lights. Install grab bars by the toilet and in the tub and shower. Do not use towel bars as grab  bars. Use non-skid mats or decals in the tub or shower. If you need to sit down in the shower, use a plastic, non-slip stool. Keep the floor dry. Clean up any water that spills on the floor as soon as it happens. Remove soap buildup in the tub or shower regularly. Attach bath mats securely with double-sided non-slip rug tape. Do not have throw rugs and other things on the floor that can make you trip. What can I do in the bedroom? Use night lights. Make sure that you have a light by your bed that is easy to reach. Do not use any sheets or blankets that are too big for your bed. They should not hang down onto the floor. Have a firm chair that has side arms. You can use this for support while you get dressed. Do not have throw rugs and other things on the floor that can make you trip. What can I do in the kitchen? Clean up any spills right away. Avoid walking on wet floors. Keep items that you use a lot in easy-to-reach places. If you need to reach something above you, use a strong  step stool that has a grab bar. Keep electrical cords out of the way. Do not use floor polish or wax that makes floors slippery. If you must use wax, use non-skid floor wax. Do not have throw rugs and other things on the floor that can make you trip. What can I do with my stairs? Do not leave any items on the stairs. Make sure that there are handrails on both sides of the stairs and use them. Fix handrails that are broken or loose. Make sure that handrails are as long as the stairways. Check any carpeting to make sure that it is firmly attached to the stairs. Fix any carpet that is loose or worn. Avoid having throw rugs at the top or bottom of the stairs. If you do have throw rugs, attach them to the floor with carpet tape. Make sure that you have a light switch at the top of the stairs and the bottom of the stairs. If you do not have them, ask someone to add them for you. What else can I do to help prevent  falls? Wear shoes that: Do not have high heels. Have rubber bottoms. Are comfortable and fit you well. Are closed at the toe. Do not wear sandals. If you use a stepladder: Make sure that it is fully opened. Do not climb a closed stepladder. Make sure that both sides of the stepladder are locked into place. Ask someone to hold it for you, if possible. Clearly mark and make sure that you can see: Any grab bars or handrails. First and last steps. Where the edge of each step is. Use tools that help you move around (mobility aids) if they are needed. These include: Canes. Walkers. Scooters. Crutches. Turn on the lights when you go into a dark area. Replace any light bulbs as soon as they burn out. Set up your furniture so you have a clear path. Avoid moving your furniture around. If any of your floors are uneven, fix them. If there are any pets around you, be aware of where they are. Review your medicines with your doctor. Some medicines can make you feel dizzy. This can increase your chance of falling. Ask your doctor what other things that you can do to help prevent falls. This information is not intended to replace advice given to you by your health care provider. Make sure you discuss any questions you have with your health care provider. Document Released: 08/28/2009 Document Revised: 04/08/2016 Document Reviewed: 12/06/2014 Elsevier Interactive Patient Education  2017 [employer].

## 2023-06-07 ENCOUNTER — Ambulatory Visit: Payer: Medicare PPO | Admitting: Medical

## 2023-06-14 ENCOUNTER — Other Ambulatory Visit: Payer: Self-pay | Admitting: Endocrinology

## 2023-06-14 DIAGNOSIS — E1165 Type 2 diabetes mellitus with hyperglycemia: Secondary | ICD-10-CM

## 2023-06-17 ENCOUNTER — Other Ambulatory Visit (HOSPITAL_COMMUNITY): Payer: Self-pay

## 2023-06-20 ENCOUNTER — Other Ambulatory Visit (INDEPENDENT_AMBULATORY_CARE_PROVIDER_SITE_OTHER): Payer: Medicare PPO

## 2023-06-20 DIAGNOSIS — E119 Type 2 diabetes mellitus without complications: Secondary | ICD-10-CM | POA: Diagnosis not present

## 2023-06-20 LAB — BASIC METABOLIC PANEL
BUN: 16 mg/dL (ref 6–23)
CO2: 26 mEq/L (ref 19–32)
Calcium: 9.9 mg/dL (ref 8.4–10.5)
Chloride: 106 mEq/L (ref 96–112)
Creatinine, Ser: 0.74 mg/dL (ref 0.40–1.20)
GFR: 77.2 mL/min (ref >60.00)
Glucose, Bld: 91 mg/dL (ref 70–99)
Potassium: 4.5 mEq/L (ref 3.5–5.1)
Sodium: 141 mEq/L (ref 135–145)

## 2023-06-20 LAB — HEMOGLOBIN A1C: Hgb A1c MFr Bld: 6.3 % (ref 4.6–6.5)

## 2023-06-24 ENCOUNTER — Ambulatory Visit: Payer: Medicare PPO | Admitting: Endocrinology

## 2023-06-24 ENCOUNTER — Encounter: Payer: Self-pay | Admitting: Endocrinology

## 2023-06-24 VITALS — BP 112/60 | HR 84 | Ht 65.5 in | Wt 144.0 lb

## 2023-06-24 DIAGNOSIS — E119 Type 2 diabetes mellitus without complications: Secondary | ICD-10-CM | POA: Diagnosis not present

## 2023-06-24 DIAGNOSIS — E7800 Pure hypercholesterolemia, unspecified: Secondary | ICD-10-CM

## 2023-06-24 LAB — MICROALBUMIN / CREATININE URINE RATIO
Creatinine,U: 73.8 mg/dL
Microalb Creat Ratio: 1.7 mg/g (ref 0.0–30.0)
Microalb, Ur: 1.2 mg/dL (ref 0.0–1.9)

## 2023-06-24 LAB — LIPID PANEL
Cholesterol: 106 mg/dL (ref 0–200)
HDL: 42.8 mg/dL (ref >39.00)
LDL Cholesterol: 43 mg/dL (ref 0–99)
NonHDL: 62.77
Total CHOL/HDL Ratio: 2
Triglycerides: 99 mg/dL (ref 0.0–149.0)
VLDL: 19.8 mg/dL (ref 0.0–40.0)

## 2023-06-24 NOTE — Patient Instructions (Signed)
No change in diabetes medicine.  Labs today for lipid and urine.

## 2023-06-24 NOTE — Progress Notes (Signed)
Outpatient Endocrinology Note [place] Gemini Bunte, MD  06/24/23  Patient's Name: Kelly Jordan    DOB: 1946-05-29    MRN: [id number]                                                    REASON OF VISIT: Follow up of type 2 diabetes mellitus  PCP: Jac Canavan, PA-C  HISTORY OF PRESENT ILLNESS:   Laurenmarie Mylin is a 77 y.o. old female with past medical history listed below, is here for  follow up of type 2 diabetes mellitus.   Pertinent Diabetes History: She was diagnosed with type 2 diabetes mellitus in 2010.  Chronic Diabetes Complications : Retinopathy: no. Last ophthalmology exam was done on 05/2022, annually. Nephropathy: no, on telmisartan Peripheral neuropathy: No Coronary artery disease: No Stroke: No  Relevant comorbidities and cardiovascular risk factors: Obesity: no Body mass index is 23.6 kg/m.  Hypertension: Yes Hyperlipidemia.  Yes on a statin.  Current / Home Diabetic regimen includes: Mounjaro 5 mg weekly. Jardiance 25 mg daily. Metformin extended release 2000 mg daily.  Prior diabetic medications: Prandin, Marcelline Deist in the past. Rybelsus up to 14 mg, stopped due to abdominal discomfort and heartburn.  Glycemic data:   Did not bring glucometer to review.  She recalls having blood sugar 90 - 120 ranghe, some of the blood sugar number, 91, 118, 81.   Hypoglycemia: Patient has no hypoglycemic episodes. Patient has hypoglycemia awareness.  Factors modifying glucose control: 1.  Diabetic diet assessment: Eating healthy.  2.  Staying active or exercising: Walking.  3.  Medication compliance: compliant all of the time.  Interval history 06/24/23 Patient has been tolerating Mounjaro well.  She lost some weight.  Blood sugar has been acceptable.  No glucometer data to review in the clinic today.  Recent hemoglobin A1c 6.3% has improved.  No other complaints today.  REVIEW OF SYSTEMS As per history of present illness.   PAST MEDICAL  HISTORY: Past Medical History:  Diagnosis Date   Cataract    Diabetes mellitus without complication (HCC)    Hyperlipidemia    Hypertension     PAST SURGICAL HISTORY: Past Surgical History:  Procedure Laterality Date   Blefoplasty  Bilateral    BUNIONECTOMY Bilateral    CATARACT EXTRACTION Bilateral 2019   COLONOSCOPY  02/07/2014   POLYPECTOMY     ROTATOR CUFF REPAIR Right    TUBAL LIGATION      ALLERGIES: No Known Allergies  FAMILY HISTORY:  Family History  Problem Relation Age of Onset   Diabetes Sister    Colon cancer Neg Hx    Esophageal cancer Neg Hx    Rectal cancer Neg Hx    Stomach cancer Neg Hx    Colon polyps Neg Hx     SOCIAL HISTORY: Social History   Socioeconomic History   Marital status: Widowed    Spouse name: Not on file   Number of children: Not on file   Years of education: Not on file   Highest education level: Not on file  Occupational History   Not on file  Tobacco Use   Smoking status: Former   Smokeless tobacco: Never   Tobacco comments:        Quit about 14 years ago.   Vaping Use   Vaping status: Never Used  Substance  and Sexual Activity   Alcohol use: Yes    Comment: Occasional wine   Drug use: Never   Sexual activity: Not on file  Other Topics Concern   Not on file  Social History Narrative   Lives with son.   Has 3 children, 6 grandchildren.   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  Exercise  - walking and exercise at at [facility].   05/2022   Social Determinants of Health   Financial Resource Strain: Low Risk  (05/24/2023)   Overall Financial Resource Strain (CARDIA)    Difficulty of Paying Living Expenses: Not hard at all  Food Insecurity: No Food Insecurity (05/24/2023)   Hunger Vital Sign    Worried About Running Out of Food in the Last Year: Never true    Ran Out of Food in the Last Year: Never true  Transportation Needs: No Transportation Needs (05/24/2023)   PRAPARE - Administrator, Civil Service  (Medical): No    Lack of Transportation (Non-Medical): No  Physical Activity: Insufficiently Active (05/24/2023)   Exercise Vital Sign    Days of Exercise per Week: 2 days    Minutes of Exercise per Session: 60 min  Stress: No Stress Concern Present (05/24/2023)   [employer] of Occupational Health - Occupational Stress Questionnaire    Feeling of Stress : Not at all  Social Connections: Moderately Integrated (05/24/2023)   Social Connection and Isolation Panel [NHANES]    Frequency of Communication with Friends and Family: More than three times a week    Frequency of Social Gatherings with Friends and Family: More than three times a week    Attends Religious Services: More than 4 times per year    Active Member of [employer] or Organizations: Yes    Attends Banker Meetings: Never    Marital Status: Widowed    MEDICATIONS:  Current Outpatient Medications  Medication Sig Dispense Refill   aspirin EC 81 MG tablet Take 81 mg by mouth daily.     calcium-vitamin D (OSCAL WITH D) 500-200 MG-UNIT tablet Take 1 tablet by mouth.     empagliflozin (JARDIANCE) 25 MG TABS tablet Take 1 tablet (25 mg total) by mouth daily before breakfast 90 tablet 0   glucose blood (ACCU-CHEK AVIVA PLUS) test strip TEST 2 TIMES A DAY IN VITRO 90 DAYS 200 strip 3   metFORMIN (GLUCOPHAGE-XR) 500 MG 24 hr tablet Take 4 tablets (2,000 mg total) by mouth daily. 360 tablet 2   rosuvastatin (CRESTOR) 10 MG tablet TAKE 1 TABLET BY MOUTH EVERY DAY 90 tablet 0   telmisartan (MICARDIS) 20 MG tablet Take 1 tablet (20 mg total) by mouth daily. 90 tablet 0   tirzepatide (MOUNJARO) 5 MG/0.5ML Pen Inject 5 mg into the skin once a week. 6 mL 1   tirzepatide (MOUNJARO) 5 MG/0.5ML Pen INJECT 5 MG SUBCUTANEOUSLY WEEKLY 2 mL 3   Current Facility-Administered Medications  Medication Dose Route Frequency Provider Last Rate Last Admin   0.9 %  sodium chloride infusion  500 mL Intravenous Once Meryl Dare, MD         PHYSICAL EXAM: Vitals:   06/24/23 1002  BP: 112/60  Pulse: 84  SpO2: 96%  Weight: 144 lb (65.3 kg)  Height: 5' 5.5" (1.664 m)   Body mass index is 23.6 kg/m.  Wt Readings from Last 3 Encounters:  06/24/23 144 lb (65.3 kg)  05/24/23 146 lb (66.2 kg)  02/22/23 148 lb 6.4  oz (67.3 kg)    General: Well developed, well nourished female in no apparent distress.  HEENT: AT/[place], no external lesions.  Eyes: Conjunctiva clear and no icterus. Neck: Neck supple  Lungs: Respirations not labored Neurologic: Alert, oriented, normal speech Extremities / Skin: Dry. No sores or rashes noted. No acanthosis nigricans Psychiatric: Does not appear depressed or anxious  Diabetic Foot Exam - Simple   No data filed    LABS Reviewed Lab Results  Component Value Date   HGBA1C 6.3 06/20/2023   HGBA1C 6.5 02/18/2023   HGBA1C 6.7 (H) 10/20/2022   Lab Results  Component Value Date   FRUCTOSAMINE 297 (H) 04/13/2022   Lab Results  Component Value Date   CHOL 106 06/24/2023   HDL 42.80 06/24/2023   LDLCALC 43 06/24/2023   TRIG 99.0 06/24/2023   CHOLHDL 2 06/24/2023   Lab Results  Component Value Date   MICRALBCREAT 3.3 04/08/2022   Lab Results  Component Value Date   CREATININE 0.74 06/20/2023   Lab Results  Component Value Date   GFR 77.20 06/20/2023    ASSESSMENT / PLAN  1. Diabetes mellitus without complication (HCC)   2. Hypercholesterolemia     Diabetes Mellitus type 2, no known complications. - Diabetic status / severity: Controlled  Lab Results  Component Value Date   HGBA1C 6.3 06/20/2023    - Hemoglobin A1c goal : <7%  - Medications: No change.  I) continue Mounjaro 5 mg weekly. II) continue Jardiance 25 mg daily. III) continue metformin extended release 2000 mg daily.  - Home glucose testing: 1-2 times a day.  Asked to bring the glucometer in the clinic follow-up visit. - Discussed/ Gave Hypoglycemia treatment plan.  # Consult : not required at this  time.   # Annual urine for microalbuminuria/ creatinine ratio, no microalbuminuria currently, continue ACE/ARB on telmisartan.  Will check urine microalbumin creatinine ratio today.  Lab Results  Component Value Date   MICRALBCREAT 3.3 04/08/2022    # Foot check nightly.  # Annual dilated diabetic eye exams, she has appointment in few weeks.   2. Blood pressure  -  BP Readings from Last 1 Encounters:  06/24/23 112/60    - Control is in target.  - No change in current plans.  3. Lipid status / Hyperlipidemia - Last  Lab Results  Component Value Date   LDLCALC 43 06/24/2023   - Continue atorvastatin 10 mg daily. -Will check lipid panel today.  Diagnoses and all orders for this visit:  Diabetes mellitus without complication (HCC) -     Microalbumin / creatinine urine ratio; Future -     Microalbumin / creatinine urine ratio -     Lipid panel; Future -     Lipid panel  Hypercholesterolemia -     Cancel: Lipid panel -     Lipid panel; Future -     Lipid panel    DISPOSITION Follow up in clinic in 4  months suggested.   All questions answered and patient verbalized understanding of the plan.  [place] Rylon Poitra, MD [facility] [address] [place], [address] [place], [place] [address] Phone # [phone]  At least part of this note was generated using voice recognition software. Inadvertent word errors may have occurred, which were not recognized during the proofreading process.

## 2023-07-07 ENCOUNTER — Other Ambulatory Visit (HOSPITAL_COMMUNITY): Payer: Self-pay

## 2023-07-07 ENCOUNTER — Ambulatory Visit (INDEPENDENT_AMBULATORY_CARE_PROVIDER_SITE_OTHER): Payer: Medicare PPO | Admitting: Medical

## 2023-07-07 ENCOUNTER — Encounter: Payer: Self-pay | Admitting: Medical

## 2023-07-07 VITALS — BP 118/68 | HR 84 | Ht 64.0 in | Wt 140.2 lb

## 2023-07-07 DIAGNOSIS — E1169 Type 2 diabetes mellitus with other specified complication: Secondary | ICD-10-CM | POA: Diagnosis not present

## 2023-07-07 DIAGNOSIS — Z780 Asymptomatic menopausal state: Secondary | ICD-10-CM

## 2023-07-07 DIAGNOSIS — I10 Essential (primary) hypertension: Secondary | ICD-10-CM

## 2023-07-07 DIAGNOSIS — M810 Age-related osteoporosis without current pathological fracture: Secondary | ICD-10-CM | POA: Diagnosis not present

## 2023-07-07 DIAGNOSIS — E118 Type 2 diabetes mellitus with unspecified complications: Secondary | ICD-10-CM

## 2023-07-07 DIAGNOSIS — E785 Hyperlipidemia, unspecified: Secondary | ICD-10-CM

## 2023-07-07 DIAGNOSIS — Z0000 Encounter for general adult medical examination without abnormal findings: Secondary | ICD-10-CM

## 2023-07-07 MED ORDER — ROSUVASTATIN CALCIUM 10 MG PO TABS
10.0000 mg | ORAL_TABLET | Freq: Every day | ORAL | 3 refills | Status: DC
  Filled 2023-07-07 – 2023-08-05 (×2): qty 90, 90d supply, fill #0
  Filled 2023-11-26: qty 90, 90d supply, fill #1
  Filled 2024-02-20: qty 90, 90d supply, fill #2
  Filled 2024-05-23: qty 90, 90d supply, fill #3

## 2023-07-07 MED ORDER — TELMISARTAN 20 MG PO TABS
20.0000 mg | ORAL_TABLET | Freq: Every day | ORAL | 3 refills | Status: DC
  Filled 2023-07-07 – 2023-08-05 (×2): qty 90, 90d supply, fill #0
  Filled 2023-10-29: qty 90, 90d supply, fill #1
  Filled 2024-02-01: qty 90, 90d supply, fill #2
  Filled 2024-05-15: qty 90, 90d supply, fill #3

## 2023-07-07 NOTE — Progress Notes (Signed)
Subjective:    Kelly Jordan is a 77 y.o. female who presents for physical  Primary Care Provider Hy Swiatek, Kermit Balo, PA-C here for primary care Chief Complaint  Patient presents with   fasting cpe    Fasting cpe, no concerns, has eye exam tomorrow    Current Health Care Team: Dentist, Dr. Irene Limbo  Eye doctor, Dr. Charlotte Sanes Dr. Reather Littler, endocrinology, but will be seeing Dr. Pia Mau soon since Dr. Lucianne Muss is retiring Dr. Claudette Head, GI  Exercise Current exercise habits: walking  Nutrition/Diet Current diet: in general, a "healthy" diet    Depression Screen    05/24/2023    2:49 PM  Depression screen PHQ 2/9  Decreased Interest 0  Down, Depressed, Hopeless 0  PHQ - 2 Score 0  Altered sleeping 0  Tired, decreased energy 0  Change in appetite 0  Feeling bad or failure about yourself  0  Trouble concentrating 0  Moving slowly or fidgety/restless 0  Suicidal thoughts 0  PHQ-9 Score 0    Fall Risk Screen    05/24/2023    2:48 PM 05/28/2022   10:07 AM 04/16/2021    8:40 AM 01/12/2021    1:43 PM  Fall Risk   Falls in the past year? 0 0 0 0  Number falls in past yr: 0 0 0 0  Injury with Fall? 0 0 0 0  Risk for fall due to : Medication side effect Medication side effect No Fall Risks No Fall Risks  Follow up Falls prevention discussed;Falls evaluation completed Falls evaluation completed;Education provided;Falls prevention discussed Falls evaluation completed Falls evaluation completed    Advanced directives Does patient have a Health Care Power of Attorney? Yes Does patient have a Living Will? Yes  Past Medical History:  Diagnosis Date   Cataract    Diabetes mellitus without complication (HCC)    Hyperlipidemia    Hypertension     Past Surgical History:  Procedure Laterality Date   Blefoplasty  Bilateral    BUNIONECTOMY Bilateral    CATARACT EXTRACTION Bilateral 2019   COLONOSCOPY  02/07/2014   POLYPECTOMY     ROTATOR CUFF REPAIR Right    TUBAL  LIGATION      Social History   Socioeconomic History   Marital status: Widowed    Spouse name: Not on file   Number of children: Not on file   Years of education: Not on file   Highest education level: Not on file  Occupational History   Not on file  Tobacco Use   Smoking status: Former   Smokeless tobacco: Never   Tobacco comments:        Quit about 14 years ago.   Vaping Use   Vaping status: Never Used  Substance and Sexual Activity   Alcohol use: Yes    Comment: Occasional wine   Drug use: Never   Sexual activity: Not on file  Other Topics Concern   Not on file  Social History Narrative   Lives with son.   Has 3 children, 6 grandchildren.   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  Exercise  - walking and exercise at at [facility].   05/2022   Social Determinants of Health   Financial Resource Strain: Low Risk  (05/24/2023)   Overall Financial Resource Strain (CARDIA)    Difficulty of Paying Living Expenses: Not hard at all  Food Insecurity: No Food Insecurity (05/24/2023)   Hunger Vital Sign    Worried About Running  Out of Food in the Last Year: Never true    Ran Out of Food in the Last Year: Never true  Transportation Needs: No Transportation Needs (05/24/2023)   PRAPARE - Administrator, Civil Service (Medical): No    Lack of Transportation (Non-Medical): No  Physical Activity: Insufficiently Active (05/24/2023)   Exercise Vital Sign    Days of Exercise per Week: 2 days    Minutes of Exercise per Session: 60 min  Stress: No Stress Concern Present (05/24/2023)   [employer] of Occupational Health - Occupational Stress Questionnaire    Feeling of Stress : Not at all  Social Connections: Moderately Integrated (05/24/2023)   Social Connection and Isolation Panel [NHANES]    Frequency of Communication with Friends and Family: More than three times a week    Frequency of Social Gatherings with Friends and Family: More than three times a week    Attends  Religious Services: More than 4 times per year    Active Member of [employer] or Organizations: Yes    Attends Banker Meetings: Never    Marital Status: Widowed  Intimate Partner Violence: Not At Risk (05/24/2023)   Humiliation, Afraid, Rape, and Kick questionnaire    Fear of Current or Ex-Partner: No    Emotionally Abused: No    Physically Abused: No    Sexually Abused: No    Family History  Problem Relation Age of Onset   Diabetes Sister    Colon cancer Neg Hx    Esophageal cancer Neg Hx    Rectal cancer Neg Hx    Stomach cancer Neg Hx    Colon polyps Neg Hx      Current Outpatient Medications:    aspirin EC 81 MG tablet, Take 81 mg by mouth daily., Disp: , Rfl:    calcium-vitamin D (OSCAL WITH D) 500-200 MG-UNIT tablet, Take 1 tablet by mouth., Disp: , Rfl:    empagliflozin (JARDIANCE) 25 MG TABS tablet, Take 1 tablet (25 mg total) by mouth daily before breakfast, Disp: 90 tablet, Rfl: 0   glucose blood (ACCU-CHEK AVIVA PLUS) test strip, TEST 2 TIMES A DAY IN VITRO 90 DAYS, Disp: 200 strip, Rfl: 3   metFORMIN (GLUCOPHAGE-XR) 500 MG 24 hr tablet, Take 4 tablets (2,000 mg total) by mouth daily., Disp: 360 tablet, Rfl: 2   rosuvastatin (CRESTOR) 10 MG tablet, TAKE 1 TABLET BY MOUTH EVERY DAY, Disp: 90 tablet, Rfl: 0   telmisartan (MICARDIS) 20 MG tablet, Take 1 tablet (20 mg total) by mouth daily., Disp: 90 tablet, Rfl: 0   tirzepatide (MOUNJARO) 5 MG/0.5ML Pen, Inject 5 mg into the skin once a week., Disp: 6 mL, Rfl: 1   tirzepatide (MOUNJARO) 5 MG/0.5ML Pen, INJECT 5 MG SUBCUTANEOUSLY WEEKLY, Disp: 2 mL, Rfl: 3  No Known Allergies  History reviewed: allergies, current medications, past family history, past medical history, past social history, past surgical history and problem list  Chronic issues discussed: hypertension - compliant with Telmisartan20mg  daily, no chest pain, no palpitation, no DOE  Hyperlipidemia-compliant with statin Crestor 10mg  without  complaint  Diabetes-sees endocrinology.  Compliant with metformin XR 500mg , 4 tablets daily, Jardiance 25mg  daily, Mounjaro 5mg  weekly     Objective:   Biometrics BP 118/68   Pulse 84   Ht 5\' 4"  (1.626 m)   Wt 140 lb 3.2 oz (63.6 kg)   BMI 24.07 kg/m   Wt Readings from Last 3 Encounters:  07/07/23 140 lb 3.2 oz (63.6 kg)  06/24/23 144 lb (65.3 kg)  05/24/23 146 lb (66.2 kg)    Gen: wd, wn ,nad Skin: unremarkable HEENT: normocephalic, sclerae anicteric Neck: supple, no lymphadenopathy, no thyromegaly, no masses, no bruits Heart: RRR, normal S1, S2, no murmurs Lungs: CTA bilaterally, no wheezes, rhonchi, or rales Abdomen: +bs, soft, non tender, non distended, no masses, no hepatomegaly, no splenomegaly Musculoskeletal: bony arthritis changes of PIPs and DIPs of both hands, nontender, no swelling, no obvious deformity Extremities: no edema, no cyanosis, no clubbing Pulses: 2+ symmetric, upper and lower extremities, normal cap refill Neurological: alert, oriented x 3, CN2-12 intact, strength normal upper extremities and lower extremities, sensation normal throughout, DTRs 2+ throughout, no cerebellar signs, gait normal Psychiatric: normal affect, behavior normal, pleasant  Breast /pelvic - declined, deferred   Diabetic Foot Exam - Simple   Simple Foot Form Visual Inspection No deformities, no ulcerations, no other skin breakdown bilaterally: Yes Sensation Testing Intact to touch and monofilament testing bilaterally: Yes Pulse Check Posterior Tibialis and Dorsalis pulse intact bilaterally: Yes Comments       Assessment:   Encounter Diagnoses  Name Primary?   Encounter for health maintenance examination in adult Yes   Hyperlipidemia associated with type 2 diabetes mellitus (HCC)    Primary hypertension    Type II diabetes mellitus with complication (HCC)    Postmenopausal estrogen deficiency    Osteoporosis, unspecified osteoporosis type, unspecified  pathological fracture presence       Plan:    This visit was a preventative care visit, also known as wellness visit or routine physical.   Topics typically include healthy lifestyle, diet, exercise, preventative care, vaccinations, sick and well care, proper use of emergency dept and after hours care, as well as other concerns.     Recommendations: Continue to return yearly for your annual wellness and preventative care visits.  This gives [place] a chance to discuss healthy lifestyle, exercise, vaccinations, review your chart record, and perform screenings where appropriate.  I recommend you see your eye doctor yearly for routine vision care.  I recommend you see your dentist yearly for routine dental care including hygiene visits twice yearly.   Vaccination recommendations were reviewed Immunization History  Administered Date(s) Administered   COVID-19, mRNA, vaccine(Comirnaty)12 years and older 09/17/2022   Hepatitis B 08/01/2002, 09/10/2002, 02/11/2003   Hepatitis B, ADULT 08/01/2002, 09/10/2002, 02/11/2003   Influenza, Quadrivalent, Recombinant, Inj, Pf 08/01/2017, 07/26/2018, 08/22/2019, 07/25/2020   Influenza-Unspecified 11/15/2012, 07/15/2022   Moderna Covid-19 Vaccine Bivalent Booster 2yrs & up 08/18/2021   PFIZER Comirnaty(Gray Top)Covid-19 Tri-Sucrose Vaccine 02/25/2021   PFIZER(Purple Top)SARS-COV-2 Vaccination 12/10/2019, 12/31/2019, 08/11/2020   PNEUMOCOCCAL CONJUGATE-20 06/03/2022   Pneumococcal Conjugate-13 10/30/2013   Pneumococcal Polysaccharide-23 02/20/2010, 04/16/2021   Pneumococcal-Unspecified 02/20/2010, 11/15/2010, 12/07/2018   Tdap 08/06/2009, 11/15/2010, 06/11/2021   Zoster Recombinant(Shingrix) 05/30/2021, 08/01/2021   Zoster, Live 03/12/2010, 11/15/2010, 10/27/2015    Get a flu shot yearly in the fall   Screening for cancer: Colon cancer screening: Reviewed 08/2022 colonoscopy with Dr. Russella Dar, no additional needed.  Breast cancer screening: We  discussed USPSTF recommendations for breast cancer screening   Skin cancer screening: Check your skin regularly for new changes, growing lesions, or other lesions of concern Come in for evaluation if you have skin lesions of concern.  Lung cancer screening: If you have a greater than 20 pack year history of tobacco use, then you may qualify for lung cancer screening with a chest CT scan.   Please call your insurance company to inquire about coverage  for this test.  We currently don't have screenings for other cancers besides breast, cervical, colon, and lung cancers.  If you have a strong family history of cancer or have other cancer screening concerns, please let me know.    Bone health: Get at least 150 minutes of aerobic exercise weekly Get weight bearing exercise at least once weekly Bone density test:  A bone density test is an imaging test that uses a type of X-ray to measure the amount of calcium and other minerals in your bones. The test may be used to diagnose or screen you for a condition that causes weak or thin bones (osteoporosis), predict your risk for a broken bone (fracture), or determine how well your osteoporosis treatment is working. The bone density test is recommended for females 65 and older, or females or males <65 if certain risk factors such as thyroid disease, long term use of steroids such as for asthma or rheumatological issues, vitamin D deficiency, estrogen deficiency, family history of osteoporosis, self or family history of fragility fracture in first degree relative.  05/2022 bone density test showed osteoporosis.  Counseled on treatment recommendations, exercise, weight bearing exercise, Ca+D, and consider medication to slow bone resorption.  She will consider options and let us know.    Heart health: Get at least 150 minutes of aerobic exercise weekly Limit alcohol It is important to maintain a healthy blood pressure and healthy cholesterol  numbers  Heart disease screening: Screening for heart disease includes screening for blood pressure, fasting lipids, glucose/diabetes screening, BMI height to weight ratio, reviewed of smoking status, physical activity, and diet.    Goals include blood pressure 120/80 or less, maintaining a healthy lipid/cholesterol profile, preventing diabetes or keeping diabetes numbers under good control, not smoking or using tobacco products, exercising most days per week or at least 150 minutes per week of exercise, and eating healthy variety of fruits and vegetables, healthy oils, and avoiding unhealthy food choices like fried food, fast food, high sugar and high cholesterol foods.    Other tests may possibly include EKG test, CT coronary calcium score, echocardiogram, exercise treadmill stress test.    Medical care options: I recommend you continue to seek care here first for routine care.  We try really hard to have available appointments Monday through Friday daytime hours for sick visits, acute visits, and physicals.  Urgent care should be used for after hours and weekends for significant issues that cannot wait till the next day.  The emergency department should be used for significant potentially life-threatening emergencies.  The emergency department is expensive, can often have long wait times for less significant concerns, so try to utilize primary care, urgent care, or telemedicine when possible to avoid unnecessary trips to the emergency department.  Virtual visits and telemedicine have been introduced since the pandemic started in 2020, and can be convenient ways to receive medical care.  We offer virtual appointments as well to assist you in a variety of options to seek medical care.   Significant separate issues Hypertension-at goal, continue current medication  Hyperlipidemia-recent labs in chart record at goal, continue Crestor 10mg  daily  Diabetes-at goal, reviewed recent labs, continue  current medicaiton.     Kelly Jordan was seen today for fasting cpe.  Diagnoses and all orders for this visit:  Encounter for health maintenance examination in adult -     CBC -     Hepatic function panel  Hyperlipidemia associated with type 2 diabetes mellitus (HCC) -  Hepatic function panel  Primary hypertension  Type II diabetes mellitus with complication (HCC)  Postmenopausal estrogen deficiency  Osteoporosis, unspecified osteoporosis type, unspecified pathological fracture presence -     VITAMIN D 25 Hydroxy (Vit-D Deficiency, Fractures)    F/u pending labs

## 2023-07-07 NOTE — Patient Instructions (Addendum)
Osteoporosis  Osteoporosis is when the bones get thin and weak. This can cause your bones to break (fracture) more easily. What are the causes? The exact cause of this condition is not known. What increases the risk? Having family members with this condition. Not eating enough healthy foods. Taking certain medicines. Being female. Being age 79 or older. Smoking or using other products that contain nicotine or tobacco, such as e-cigarettes or chewing tobacco. Not exercising. Being of European or Asian ancestry. Having a small body frame. What are the signs or symptoms? A broken bone might be the first sign, especially if the break results from a fall or injury that usually would not cause a bone to break. Other signs and symptoms include: Pain in the neck or low back. Being hunched over (stooped posture). Getting shorter. How is this treated? Eating more foods with more calcium and vitamin D in them. Doing exercises. Stopping tobacco use. Limiting how much alcohol you drink. Taking medicines to slow bone loss or help make the bones stronger. Taking supplements of calcium and vitamin D every day. Taking medicines to replace chemicals in the body (hormone replacement medicines). Monitoring your levels of calcium and vitamin D. The goal of treatment is to strengthen your bones and lower your risk for a bone break. Follow these instructions at home: Eating and drinking Eat plenty of calcium and vitamin D. These nutrients are good for your bones. Good sources of calcium and vitamin D include: Some fish, such as salmon and tuna. Foods that have calcium and vitamin D added to them (fortified foods), such as some breakfast cereals. Egg yolks. Cheese. Liver.  Activity Do exercises as told by your doctor. Ask your doctor what exercises are safe for you. You should do: Exercises that make your muscles work to hold your body weight up (weight-bearing exercises). These include tai chi,  yoga, and walking. Exercises to make your muscles stronger. One example is lifting weights. Lifestyle Do not drink alcohol if: Your doctor tells you not to drink. You are pregnant, may be pregnant, or are planning to become pregnant. If you drink alcohol: Limit how much you use to: 0-1 drink a day for women. 0-2 drinks a day for men. Know how much alcohol is in your drink. In the [place] one drink equals one 12 oz bottle of beer (355 mL), one 5 oz glass of wine (148 mL), or one 1 oz glass of hard liquor (44 mL). Do not smoke or use any products that contain nicotine or tobacco. If you need help quitting, ask your doctor. Preventing falls Use tools to help you move around (mobility aids) as needed. These include canes, walkers, scooters, and crutches. Keep rooms well-lit. Put away things on the floor that could make you trip. These include cords and rugs. Install safety rails on stairs. Install grab bars in bathrooms. Use rubber mats in slippery areas, like bathrooms. Wear shoes that: Fit you well. Support your feet. Have closed toes. Have rubber soles or low heels. Tell your doctor about all of the medicines you are taking. Some medicines can make you more likely to fall. General instructions Take over-the-counter and prescription medicines only as told by your doctor. Keep all follow-up visits. Contact a doctor if: You have not been tested (screened) for osteoporosis and you are: A woman who is age 2 or older. A man who is age 29 or older. Get help right away if: You fall. You get hurt. Summary Osteoporosis happens when your  bones get thin and weak. Weak bones can break (fracture) more easily. Eat plenty of calcium and vitamin D. These are good for your bones. Tell your doctor about all of the medicines that you take. This information is not intended to replace advice given to you by your health care provider. Make sure you discuss any questions you have with your health care  provider. Document Revised: 04/17/2020 Document Reviewed: 04/17/2020 Elsevier Patient Education  2024 Elsevier Inc.    Options for therapy:   Bisphosphonate such as alendronate (Fosamax) This is a medication given weekly that strengthens bones by slowing down the rate of bone loss.  This is usually the first-line medication given the lower cost and does not require injection.  Many people tolerate this medication but some do not.  This medicine has to be taken weekly first thing in the morning on empty stomach, and you cannot lie down for an hour after taking the medication.  Risks of the medication includes trouble swallowing the medication, inflammation of the esophagus, gastric ulcers, and rare risk of breakdown of the jawbone.  These medications are usually given for 5 years (3 years if injectable bisphosphonate like Reclast).   Evenity  Compared with bisphosphonates, this type of medication called monoclonal antibodies produce similar or better bone density results and reduces the chance of all types of fractures. Evenity is delivered via a shot under the skin every month.   Recent research indicates there could be a high risk of spinal column fractures after stopping the drug.  A very rare complication of bisphosphonates and denosumab is a break or crack in the middle of the thighbone.  A second rare complication is delayed healing of the jawbone (osteonecrosis of the jaw). This can occur after an invasive dental procedure such as removing a tooth.   Forteo This medication increases bone density and strength, it is a synthetic version of the parathyroid hormone, and it is given as a daily injection for 2 years.  Risk include leg cramps, nausea, dizziness, elevated calcium, joint pain, and rare risk of bone cancer in animal studies.  Prolia This is an injection given twice yearly that prevents bone dissolving osteoclast cells from forming.  Its a good option for women who can't tolerate  bisphosphonate.  The advantages not having to take something every day or every month and it is well-tolerated for the most part.  This type of medication is called a monoclonal antibody, so there are risk of low blood calcium, skin infections, rash, and rarer potential risks are breakdown or death of jawbone, and rare risk of fracture of the thigh bone.  This medication is given ongoing.  Calcitonin  This is an old drug that helps prevent bone loss, used as a daily nasal spray or injection.  It  reduces spinal fractures, but is not effective in other types of fractures so it is usually not the first-line option.  Side effects can include flushing, rash.  There is a small increase in cancer risk with this medication.  Evista This is a selective estrogen receptor modulator (SERM), and is used in breast cancer prevention and treatment.  It is also used in treatment of osteoporosis.   It is used in women to reduce risk of vertebral fractures.  Side effects include hot flashes, muscle pain, and increased risks of blood clots in the leg.

## 2023-07-08 ENCOUNTER — Other Ambulatory Visit: Payer: Self-pay | Admitting: Medical

## 2023-07-08 ENCOUNTER — Other Ambulatory Visit (HOSPITAL_COMMUNITY): Payer: Self-pay

## 2023-07-08 DIAGNOSIS — H52203 Unspecified astigmatism, bilateral: Secondary | ICD-10-CM | POA: Diagnosis not present

## 2023-07-08 DIAGNOSIS — Z961 Presence of intraocular lens: Secondary | ICD-10-CM | POA: Diagnosis not present

## 2023-07-08 LAB — CBC
Hematocrit: 43 % (ref 34.0–46.6)
Hemoglobin: 14.6 g/dL (ref 11.1–15.9)
MCH: 30.4 pg (ref 26.6–33.0)
MCHC: 34 g/dL (ref 31.5–35.7)
MCV: 90 fL (ref 79–97)
Platelets: 200 10*3/uL (ref 150–450)
RBC: 4.8 x10E6/uL (ref 3.77–5.28)
RDW: 12.3 % (ref 11.7–15.4)
WBC: 5.5 10*3/uL (ref 3.4–10.8)

## 2023-07-08 LAB — HEPATIC FUNCTION PANEL
ALT: 38 IU/L — ABNORMAL HIGH (ref 0–32)
AST: 31 IU/L (ref 0–40)
Albumin: 4.5 g/dL (ref 3.8–4.8)
Alkaline Phosphatase: 76 IU/L (ref 44–121)
Bilirubin Total: 0.8 mg/dL (ref 0.0–1.2)
Bilirubin, Direct: 0.24 mg/dL (ref 0.00–0.40)
Total Protein: 6.5 g/dL (ref 6.0–8.5)

## 2023-07-08 LAB — VITAMIN D 25 HYDROXY (VIT D DEFICIENCY, FRACTURES): Vit D, 25-Hydroxy: 25.3 ng/mL — ABNORMAL LOW (ref 30.0–100.0)

## 2023-07-08 LAB — HM DIABETES EYE EXAM

## 2023-07-08 MED ORDER — VITAMIN D (ERGOCALCIFEROL) 1.25 MG (50000 UNIT) PO CAPS
50000.0000 [IU] | ORAL_CAPSULE | ORAL | 1 refills | Status: DC
  Filled 2023-07-08: qty 12, 84d supply, fill #0
  Filled 2023-10-03 (×2): qty 12, 84d supply, fill #1

## 2023-07-08 NOTE — Progress Notes (Signed)
Results sent through MyChart

## 2023-07-11 ENCOUNTER — Encounter: Payer: Self-pay | Admitting: Internal Medicine

## 2023-07-11 ENCOUNTER — Other Ambulatory Visit (HOSPITAL_COMMUNITY): Payer: Self-pay

## 2023-07-12 ENCOUNTER — Other Ambulatory Visit (HOSPITAL_COMMUNITY): Payer: Self-pay

## 2023-07-19 ENCOUNTER — Other Ambulatory Visit: Payer: Self-pay | Admitting: Medical

## 2023-07-19 DIAGNOSIS — Z1231 Encounter for screening mammogram for malignant neoplasm of breast: Secondary | ICD-10-CM

## 2023-08-05 ENCOUNTER — Other Ambulatory Visit (HOSPITAL_COMMUNITY): Payer: Self-pay

## 2023-08-08 ENCOUNTER — Other Ambulatory Visit: Payer: Self-pay

## 2023-08-10 ENCOUNTER — Other Ambulatory Visit (HOSPITAL_COMMUNITY): Payer: Self-pay

## 2023-08-12 ENCOUNTER — Other Ambulatory Visit (HOSPITAL_COMMUNITY): Payer: Self-pay

## 2023-08-16 ENCOUNTER — Ambulatory Visit
Admission: RE | Admit: 2023-08-16 | Discharge: 2023-08-16 | Disposition: A | Discharge status: Home / Self Care | Payer: Medicare PPO | Source: Ambulatory Visit | Attending: Medical | Admitting: Medical

## 2023-08-16 DIAGNOSIS — Z1231 Encounter for screening mammogram for malignant neoplasm of breast: Secondary | ICD-10-CM | POA: Diagnosis not present

## 2023-08-17 NOTE — Progress Notes (Signed)
Results sent through MyChart

## 2023-08-28 ENCOUNTER — Other Ambulatory Visit: Payer: Self-pay

## 2023-08-31 ENCOUNTER — Other Ambulatory Visit: Payer: Self-pay | Admitting: Endocrinology

## 2023-08-31 ENCOUNTER — Other Ambulatory Visit (HOSPITAL_COMMUNITY): Payer: Self-pay

## 2023-08-31 MED ORDER — EMPAGLIFLOZIN 25 MG PO TABS
25.0000 mg | ORAL_TABLET | Freq: Every day | ORAL | 1 refills | Status: DC
  Filled 2023-08-31: qty 90, 90d supply, fill #0
  Filled 2023-11-26: qty 90, 90d supply, fill #1

## 2023-09-06 ENCOUNTER — Other Ambulatory Visit (HOSPITAL_COMMUNITY): Payer: Self-pay

## 2023-10-03 ENCOUNTER — Other Ambulatory Visit (HOSPITAL_COMMUNITY): Payer: Self-pay

## 2023-10-04 ENCOUNTER — Other Ambulatory Visit: Payer: Self-pay

## 2023-10-11 ENCOUNTER — Other Ambulatory Visit (HOSPITAL_COMMUNITY): Payer: Self-pay

## 2023-10-18 ENCOUNTER — Telehealth: Payer: Self-pay

## 2023-10-18 NOTE — Telephone Encounter (Signed)
labs

## 2023-10-20 ENCOUNTER — Other Ambulatory Visit: Payer: Medicare PPO

## 2023-10-20 ENCOUNTER — Other Ambulatory Visit: Payer: Self-pay

## 2023-10-25 ENCOUNTER — Other Ambulatory Visit (HOSPITAL_COMMUNITY): Payer: Self-pay

## 2023-10-25 ENCOUNTER — Encounter: Payer: Self-pay | Admitting: Endocrinology

## 2023-10-25 ENCOUNTER — Ambulatory Visit: Payer: Medicare PPO | Admitting: Endocrinology

## 2023-10-25 VITALS — BP 120/80 | HR 91 | Resp 20 | Ht 64.0 in | Wt 140.8 lb

## 2023-10-25 DIAGNOSIS — Z7985 Long-term (current) use of injectable non-insulin antidiabetic drugs: Secondary | ICD-10-CM

## 2023-10-25 DIAGNOSIS — Z7984 Long term (current) use of oral hypoglycemic drugs: Secondary | ICD-10-CM

## 2023-10-25 DIAGNOSIS — E119 Type 2 diabetes mellitus without complications: Secondary | ICD-10-CM

## 2023-10-25 LAB — POCT GLYCOSYLATED HEMOGLOBIN (HGB A1C): Hemoglobin A1C: 5.9 % — AB (ref 4.0–5.6)

## 2023-10-25 MED ORDER — TIRZEPATIDE 5 MG/0.5ML ~~LOC~~ SOAJ
5.0000 mg | SUBCUTANEOUS | 4 refills | Status: DC
  Filled 2023-10-25 – 2023-11-02 (×2): qty 6, 84d supply, fill #0
  Filled 2023-12-02 – 2024-01-31 (×2): qty 6, 84d supply, fill #1
  Filled 2024-04-19: qty 6, 84d supply, fill #2

## 2023-10-25 MED ORDER — METFORMIN HCL ER 500 MG PO TB24
500.0000 mg | ORAL_TABLET | Freq: Two times a day (BID) | ORAL | 2 refills | Status: DC
Start: 2023-10-25 — End: 2024-06-28
  Filled 2023-10-25 (×2): qty 180, 90d supply, fill #0
  Filled 2024-01-31: qty 180, 90d supply, fill #1
  Filled 2024-05-23: qty 180, 90d supply, fill #2

## 2023-10-25 NOTE — Patient Instructions (Signed)
Decrease metformin to 1 tab two times a day and rest meds same.

## 2023-10-25 NOTE — Progress Notes (Signed)
Outpatient Endocrinology Note Kelly Kemya Shed, MD  10/25/23  Patient's Name: Kelly Jordan    DOB: Jun 17, 1946    MRN: [id number]                                                    REASON OF VISIT: Follow up of type 2 diabetes mellitus  PCP: Jac Canavan, PA-C  HISTORY OF PRESENT ILLNESS:   Kelly Jordan is a 77 y.o. old female with past medical history listed below, is here for  follow up of type 2 diabetes mellitus.   Pertinent Diabetes History: She was diagnosed with type 2 diabetes mellitus in 2010.  Chronic Diabetes Complications : Retinopathy: no. Last ophthalmology exam was done on 06/2023, reviewed note, annually. Nephropathy: no, on telmisartan Peripheral neuropathy: No Coronary artery disease: No Stroke: No  Relevant comorbidities and cardiovascular risk factors: Obesity: no Body mass index is 24.17 kg/m.  Hypertension: Yes Hyperlipidemia.  Yes on a statin.  Current / Home Diabetic regimen includes: Mounjaro 5 mg weekly. Jardiance 25 mg daily. Metformin extended release 1000 mg 2 times a day.  Prior diabetic medications: Prandin, Marcelline Deist in the past. Rybelsus up to 14 mg, stopped due to abdominal discomfort and heartburn.  Glycemic data:   Not able to download glucometer in the clinic today however glucose data reviewed directly from the meter as follows : Been taking in the morning fasting :96, 97, 107, 104, 139.  Hypoglycemia: Patient has no hypoglycemic episodes. Patient has hypoglycemia awareness.  Factors modifying glucose control: 1.  Diabetic diet assessment: Eating healthy.  2.  Staying active or exercising: Walking.  3.  Medication compliance: compliant all of the time.  Interval history  Diabetes regimen as noted above.  Denies any GI issues regarding metformin or Mounjaro.  She lost about 4 pounds of weight in 3 months, she does not want to lose anymore weight.  Hemoglobin A1c r today 5.9%, improved.  No other complaints  today.  REVIEW OF SYSTEMS As per history of present illness.   PAST MEDICAL HISTORY: Past Medical History:  Diagnosis Date   Cataract    Diabetes mellitus without complication (HCC)    Hyperlipidemia    Hypertension     PAST SURGICAL HISTORY: Past Surgical History:  Procedure Laterality Date   Blefoplasty  Bilateral    BUNIONECTOMY Bilateral    CATARACT EXTRACTION Bilateral 2019   COLONOSCOPY  02/07/2014   POLYPECTOMY     ROTATOR CUFF REPAIR Right    TUBAL LIGATION      ALLERGIES: No Known Allergies  FAMILY HISTORY:  Family History  Problem Relation Age of Onset   Diabetes Sister    Colon cancer Neg Hx    Esophageal cancer Neg Hx    Rectal cancer Neg Hx    Stomach cancer Neg Hx    Colon polyps Neg Hx     SOCIAL HISTORY: Social History   Socioeconomic History   Marital status: Widowed    Spouse name: Not on file   Number of children: Not on file   Years of education: Not on file   Highest education level: Not on file  Occupational History   Not on file  Tobacco Use   Smoking status: Former   Smokeless tobacco: Never   Tobacco comments:        Quit  about 14 years ago.   Vaping Use   Vaping status: Never Used  Substance and Sexual Activity   Alcohol use: Yes    Comment: Occasional wine   Drug use: Never   Sexual activity: Not on file  Other Topics Concern   Not on file  Social History Narrative   Lives with son.   Has 3 children, 6 grandchildren.   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  Exercise  - walking and exercise at at [facility].   05/2022   Social Determinants of Health   Financial Resource Strain: Low Risk  (05/24/2023)   Overall Financial Resource Strain (CARDIA)    Difficulty of Paying Living Expenses: Not hard at all  Food Insecurity: No Food Insecurity (05/24/2023)   Hunger Vital Sign    Worried About Running Out of Food in the Last Year: Never true    Ran Out of Food in the Last Year: Never true  Transportation Needs: No  Transportation Needs (05/24/2023)   PRAPARE - Administrator, Civil Service (Medical): No    Lack of Transportation (Non-Medical): No  Physical Activity: Insufficiently Active (05/24/2023)   Exercise Vital Sign    Days of Exercise per Week: 2 days    Minutes of Exercise per Session: 60 min  Stress: No Stress Concern Present (05/24/2023)   [employer] of Occupational Health - Occupational Stress Questionnaire    Feeling of Stress : Not at all  Social Connections: Moderately Integrated (05/24/2023)   Social Connection and Isolation Panel [NHANES]    Frequency of Communication with Friends and Family: More than three times a week    Frequency of Social Gatherings with Friends and Family: More than three times a week    Attends Religious Services: More than 4 times per year    Active Member of [employer] or Organizations: Yes    Attends Banker Meetings: Never    Marital Status: Widowed    MEDICATIONS:  Current Outpatient Medications  Medication Sig Dispense Refill   aspirin EC 81 MG tablet Take 81 mg by mouth daily.     calcium-vitamin D (OSCAL WITH D) 500-200 MG-UNIT tablet Take 1 tablet by mouth.     empagliflozin (JARDIANCE) 25 MG TABS tablet Take 1 tablet (25 mg total) by mouth daily before breakfast 90 tablet 1   glucose blood (ACCU-CHEK AVIVA PLUS) test strip TEST 2 TIMES A DAY IN VITRO 90 DAYS 200 strip 3   rosuvastatin (CRESTOR) 10 MG tablet Take 1 tablet (10 mg total) by mouth daily. 90 tablet 3   telmisartan (MICARDIS) 20 MG tablet Take 1 tablet (20 mg total) by mouth daily. 90 tablet 3   Vitamin D, Ergocalciferol, (DRISDOL) 1.25 MG (50000 UNIT) CAPS capsule Take 1 capsule (50,000 Units total) by mouth every 7 (seven) days. 12 capsule 1   metFORMIN (GLUCOPHAGE-XR) 500 MG 24 hr tablet Take 1 tablet (500 mg total) by mouth 2 (two) times daily with a meal. 180 tablet 2   tirzepatide (MOUNJARO) 5 MG/0.5ML Pen Inject 5 mg into the skin once a week. 6 mL 4   No  current facility-administered medications for this visit.    PHYSICAL EXAM: Vitals:   10/25/23 1004  BP: 120/80  Pulse: 91  Resp: 20  SpO2: 98%  Weight: 140 lb 12.8 oz (63.9 kg)  Height: 5\' 4"  (1.626 m)   Body mass index is 24.17 kg/m.  Wt Readings from Last 3 Encounters:  10/25/23 140  lb 12.8 oz (63.9 kg)  07/07/23 140 lb 3.2 oz (63.6 kg)  06/24/23 144 lb (65.3 kg)    General: Well developed, well nourished female in no apparent distress.  HEENT: AT/[place], no external lesions.  Eyes: Conjunctiva clear and no icterus. Neck: Neck supple  Lungs: Respirations not labored Neurologic: Alert, oriented, normal speech Extremities / Skin: Dry. No sores or rashes noted.  Psychiatric: Does not appear depressed or anxious  Diabetic Foot Exam - Simple   Simple Foot Form Diabetic Foot exam was performed with the following findings: Yes 10/25/2023 10:33 AM  Visual Inspection See comments: Yes Sensation Testing Intact to touch and monofilament testing bilaterally: Yes Pulse Check See comments: Yes Comments Dp 2+ bilaterally. B/I callus present.     LABS Reviewed Lab Results  Component Value Date   HGBA1C 5.9 (A) 10/25/2023   HGBA1C 6.3 06/20/2023   HGBA1C 6.5 02/18/2023   Lab Results  Component Value Date   FRUCTOSAMINE 297 (H) 04/13/2022   Lab Results  Component Value Date   CHOL 106 06/24/2023   HDL 42.80 06/24/2023   LDLCALC 43 06/24/2023   TRIG 99.0 06/24/2023   CHOLHDL 2 06/24/2023   Lab Results  Component Value Date   MICRALBCREAT 1.7 06/24/2023   MICRALBCREAT 3.3 04/08/2022   Lab Results  Component Value Date   CREATININE 0.74 06/20/2023   Lab Results  Component Value Date   GFR 77.20 06/20/2023    ASSESSMENT / PLAN  1. Controlled type 2 diabetes mellitus without complication, without long-term current use of insulin (HCC)   2. Diabetes mellitus without complication (HCC)     Diabetes Mellitus type 2, no known complications. - Diabetic status /  severity: Controlled  Lab Results  Component Value Date   HGBA1C 5.9 (A) 10/25/2023    - Hemoglobin A1c goal : <7%  Patient has improving and well-controlled type 2 diabetes mellitus.  She is wondering about if we can decrease some of the medication.  Will decrease metformin as follows.  - Medications: No change.  I) continue Mounjaro 5 mg weekly. II) continue Jardiance 25 mg daily. III) decrease metformin extended release from 1000 mg 2 times a day to 500 mg 2 times a day.  - Home glucose testing: 1-2 times a day.    - Discussed/ Gave Hypoglycemia treatment plan.  # Consult : not required at this time.   # Annual urine for microalbuminuria/ creatinine ratio, no microalbuminuria currently, continue ACE/ARB on telmisartan.    Lab Results  Component Value Date   MICRALBCREAT 1.7 06/24/2023    # Foot check nightly.  # Annual dilated diabetic eye exams, no diabetic retinopathy.   2. Blood pressure  -  BP Readings from Last 1 Encounters:  10/25/23 120/80    - Control is in target.  - No change in current plans.  3. Lipid status / Hyperlipidemia - Last  Lab Results  Component Value Date   LDLCALC 43 06/24/2023   - Continue atorvastatin 10 mg daily.  Diagnoses and all orders for this visit:  Controlled type 2 diabetes mellitus without complication, without long-term current use of insulin (HCC) -     metFORMIN (GLUCOPHAGE-XR) 500 MG 24 hr tablet; Take 1 tablet (500 mg total) by mouth 2 (two) times daily with a meal. -     Basic metabolic panel -     Hemoglobin A1c  Diabetes mellitus without complication (HCC) -     POCT glycosylated hemoglobin (Hb A1C)  Other orders -  tirzepatide [facility]) 5 MG/0.5ML Pen; Inject 5 mg into the skin once a week.    DISPOSITION Follow up in clinic in 4  months suggested.   All questions answered and patient verbalized understanding of the plan.  Kelly Dylynn Ketner, MD [facility] [address] [place], [address] [place], [place] [address] Phone # [phone]  At least part of this note was generated using voice recognition software. Inadvertent word errors may have occurred, which were not recognized during the proofreading process.

## 2023-11-02 ENCOUNTER — Other Ambulatory Visit (HOSPITAL_COMMUNITY): Payer: Self-pay

## 2023-11-28 ENCOUNTER — Other Ambulatory Visit (HOSPITAL_COMMUNITY): Payer: Self-pay

## 2023-12-05 ENCOUNTER — Encounter (HOSPITAL_COMMUNITY): Payer: Self-pay | Admitting: Pharmacist

## 2023-12-05 ENCOUNTER — Other Ambulatory Visit (HOSPITAL_COMMUNITY): Payer: Self-pay

## 2023-12-06 ENCOUNTER — Other Ambulatory Visit (HOSPITAL_COMMUNITY): Payer: Self-pay

## 2024-01-02 ENCOUNTER — Other Ambulatory Visit (HOSPITAL_COMMUNITY): Payer: Self-pay

## 2024-01-02 ENCOUNTER — Other Ambulatory Visit: Payer: Self-pay | Admitting: Medical

## 2024-01-02 MED ORDER — VITAMIN D (ERGOCALCIFEROL) 1.25 MG (50000 UNIT) PO CAPS
50000.0000 [IU] | ORAL_CAPSULE | ORAL | 1 refills | Status: DC
  Filled 2024-01-02: qty 12, 84d supply, fill #0
  Filled 2024-01-31 – 2024-03-12 (×2): qty 12, 84d supply, fill #1

## 2024-01-03 ENCOUNTER — Other Ambulatory Visit (HOSPITAL_COMMUNITY): Payer: Self-pay

## 2024-02-01 ENCOUNTER — Other Ambulatory Visit: Payer: Self-pay

## 2024-02-01 ENCOUNTER — Other Ambulatory Visit (HOSPITAL_COMMUNITY): Payer: Self-pay

## 2024-02-10 ENCOUNTER — Other Ambulatory Visit: Payer: Self-pay | Admitting: Endocrinology

## 2024-02-13 ENCOUNTER — Other Ambulatory Visit (HOSPITAL_COMMUNITY): Payer: Self-pay

## 2024-02-13 MED ORDER — EMPAGLIFLOZIN 25 MG PO TABS
25.0000 mg | ORAL_TABLET | Freq: Every day | ORAL | 1 refills | Status: DC
  Filled 2024-02-20: qty 90, 90d supply, fill #0
  Filled 2024-05-15: qty 90, 90d supply, fill #1

## 2024-02-15 ENCOUNTER — Other Ambulatory Visit: Payer: Self-pay

## 2024-02-20 ENCOUNTER — Other Ambulatory Visit: Payer: Medicare PPO

## 2024-02-20 ENCOUNTER — Other Ambulatory Visit (HOSPITAL_COMMUNITY): Payer: Self-pay

## 2024-02-20 DIAGNOSIS — E1165 Type 2 diabetes mellitus with hyperglycemia: Secondary | ICD-10-CM | POA: Diagnosis not present

## 2024-02-21 LAB — BASIC METABOLIC PANEL WITH GFR
BUN: 18 mg/dL (ref 7–25)
CO2: 29 mmol/L (ref 20–32)
Calcium: 9.9 mg/dL (ref 8.6–10.4)
Chloride: 107 mmol/L (ref 98–110)
Creat: 0.77 mg/dL (ref 0.60–1.00)
Glucose, Bld: 104 mg/dL — ABNORMAL HIGH (ref 65–99)
Potassium: 4.7 mmol/L (ref 3.5–5.3)
Sodium: 140 mmol/L (ref 135–146)
eGFR: 78 mL/min/{1.73_m2} (ref >=60)

## 2024-02-21 LAB — HEMOGLOBIN A1C
Hgb A1c MFr Bld: 6.1 %{Hb} — ABNORMAL HIGH (ref <5.7)
Mean Plasma Glucose: 128 mg/dL
eAG (mmol/L): 7.1 mmol/L

## 2024-02-22 ENCOUNTER — Encounter: Payer: Self-pay | Admitting: Endocrinology

## 2024-02-24 ENCOUNTER — Ambulatory Visit: Payer: Medicare PPO | Admitting: Endocrinology

## 2024-02-24 ENCOUNTER — Encounter: Payer: Self-pay | Admitting: Endocrinology

## 2024-02-24 VITALS — BP 100/60 | HR 78 | Resp 20 | Ht 64.0 in | Wt 144.0 lb

## 2024-02-24 DIAGNOSIS — E7800 Pure hypercholesterolemia, unspecified: Secondary | ICD-10-CM

## 2024-02-24 DIAGNOSIS — Z7985 Long-term (current) use of injectable non-insulin antidiabetic drugs: Secondary | ICD-10-CM

## 2024-02-24 DIAGNOSIS — Z7984 Long term (current) use of oral hypoglycemic drugs: Secondary | ICD-10-CM | POA: Diagnosis not present

## 2024-02-24 DIAGNOSIS — E119 Type 2 diabetes mellitus without complications: Secondary | ICD-10-CM | POA: Diagnosis not present

## 2024-02-24 NOTE — Patient Instructions (Signed)
 Latest Reference Range & Units 06/20/23 11:20 10/25/23 10:17 02/20/24 09:00  Hemoglobin A1C <5.7 % of total Hgb 6.3 5.9 ! Pend 6.1 (H)  !: Data is abnormal (H): Data is abnormally high

## 2024-02-24 NOTE — Progress Notes (Signed)
 Outpatient Endocrinology Note [place] Daysean Tinkham, MD  02/24/24  Patient's Name: Kelly Jordan    DOB: 29-May-1946    MRN: [id number]                                                    REASON OF VISIT: Follow up of type 2 diabetes mellitus  PCP: Jac Canavan, PA-C  HISTORY OF PRESENT ILLNESS:   Kelly Jordan is a 78 y.o. old female with past medical history listed below, is here for  follow up of type 2 diabetes mellitus.   Pertinent Diabetes History: She was diagnosed with type 2 diabetes mellitus in 2010.  Chronic Diabetes Complications : Retinopathy: no. Last ophthalmology exam was done on 06/2023, reviewed note, annually. Nephropathy: no, on telmisartan Peripheral neuropathy: No Coronary artery disease: No Stroke: No  Relevant comorbidities and cardiovascular risk factors: Obesity: no Body mass index is 24.72 kg/m.  Hypertension: Yes Hyperlipidemia.  Yes on a statin.  Current / Home Diabetic regimen includes: Mounjaro 5 mg weekly. Jardiance 25 mg daily. Metformin extended release 500 mg 2 times a day.  Prior diabetic medications: Prandin, Marcelline Deist in the past. Rybelsus up to 14 mg, stopped due to abdominal discomfort and heartburn.  Glycemic data:   Not able to download glucometer in the clinic today, blood glucose reviewed daily from the meter, blood sugar 107, 107, 110, 117, 122, 155, 105, she has been checking in the morning fasting and at different times of the day few times in a week.  Hypoglycemia: Patient has no hypoglycemic episodes. Patient has hypoglycemia awareness.  Factors modifying glucose control: 1.  Diabetic diet assessment: Eating healthy.  2.  Staying active or exercising: Walking.  3.  Medication compliance: compliant all of the time.  Interval history  Recent A1c 6.1%.  Glucometer data as reviewed above.  She has been tolerating Mounjaro well.  No GI issues.  Diabetes regimen reviewed and as noted above.  She gained about 4  pounds of weight in last 3 months.  Patient reports he has been eating extra usually in the evening lately.  No other complaints today.   REVIEW OF SYSTEMS As per history of present illness.   PAST MEDICAL HISTORY: Past Medical History:  Diagnosis Date   Cataract    Diabetes mellitus without complication (HCC)    Hyperlipidemia    Hypertension     PAST SURGICAL HISTORY: Past Surgical History:  Procedure Laterality Date   Blefoplasty  Bilateral    BUNIONECTOMY Bilateral    CATARACT EXTRACTION Bilateral 2019   COLONOSCOPY  02/07/2014   POLYPECTOMY     ROTATOR CUFF REPAIR Right    TUBAL LIGATION      ALLERGIES: No Known Allergies  FAMILY HISTORY:  Family History  Problem Relation Age of Onset   Diabetes Sister    Colon cancer Neg Hx    Esophageal cancer Neg Hx    Rectal cancer Neg Hx    Stomach cancer Neg Hx    Colon polyps Neg Hx     SOCIAL HISTORY: Social History   Socioeconomic History   Marital status: Widowed    Spouse name: Not on file   Number of children: Not on file   Years of education: Not on file   Highest education level: Not on file  Occupational History  Not on file  Tobacco Use   Smoking status: Former   Smokeless tobacco: Never   Tobacco comments:        Quit about 14 years ago.   Vaping Use   Vaping status: Never Used  Substance and Sexual Activity   Alcohol use: Yes    Comment: Occasional wine   Drug use: Never   Sexual activity: Not on file  Other Topics Concern   Not on file  Social History Narrative   Lives with son.   Has 3 children, 6 grandchildren.   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  Exercise  - walking and exercise at at [facility].   05/2022   Social Drivers of Health   Financial Resource Strain: Low Risk  (05/24/2023)   Overall Financial Resource Strain (CARDIA)    Difficulty of Paying Living Expenses: Not hard at all  Food Insecurity: No Food Insecurity (05/24/2023)   Hunger Vital Sign    Worried About  Running Out of Food in the Last Year: Never true    Ran Out of Food in the Last Year: Never true  Transportation Needs: No Transportation Needs (05/24/2023)   PRAPARE - Administrator, Civil Service (Medical): No    Lack of Transportation (Non-Medical): No  Physical Activity: Insufficiently Active (05/24/2023)   Exercise Vital Sign    Days of Exercise per Week: 2 days    Minutes of Exercise per Session: 60 min  Stress: No Stress Concern Present (05/24/2023)   [employer] of Occupational Health - Occupational Stress Questionnaire    Feeling of Stress : Not at all  Social Connections: Moderately Integrated (05/24/2023)   Social Connection and Isolation Panel [NHANES]    Frequency of Communication with Friends and Family: More than three times a week    Frequency of Social Gatherings with Friends and Family: More than three times a week    Attends Religious Services: More than 4 times per year    Active Member of [employer] or Organizations: Yes    Attends Banker Meetings: Never    Marital Status: Widowed    MEDICATIONS:  Current Outpatient Medications  Medication Sig Dispense Refill   aspirin EC 81 MG tablet Take 81 mg by mouth daily.     calcium-vitamin D (OSCAL WITH D) 500-200 MG-UNIT tablet Take 1 tablet by mouth.     empagliflozin (JARDIANCE) 25 MG TABS tablet Take 1 tablet (25 mg total) by mouth daily before breakfast 90 tablet 1   glucose blood (ACCU-CHEK AVIVA PLUS) test strip TEST 2 TIMES A DAY IN VITRO 90 DAYS 200 strip 3   metFORMIN (GLUCOPHAGE-XR) 500 MG 24 hr tablet Take 1 tablet (500 mg total) by mouth 2 (two) times daily with a meal. 180 tablet 2   rosuvastatin (CRESTOR) 10 MG tablet Take 1 tablet (10 mg total) by mouth daily. 90 tablet 3   telmisartan (MICARDIS) 20 MG tablet Take 1 tablet (20 mg total) by mouth daily. 90 tablet 3   tirzepatide (MOUNJARO) 5 MG/0.5ML Pen Inject 5 mg into the skin once a week. 6 mL 4   Vitamin D, Ergocalciferol, (DRISDOL)  1.25 MG (50000 UNIT) CAPS capsule Take 1 capsule (50,000 Units total) by mouth every 7 (seven) days. 12 capsule 1   No current facility-administered medications for this visit.    PHYSICAL EXAM: Vitals:   02/24/24 0853  BP: 100/60  Pulse: 78  Resp: 20  SpO2: 99%  Weight: 144 lb (  65.3 kg)  Height: 5\' 4"  (1.626 m)   Body mass index is 24.72 kg/m.  Wt Readings from Last 3 Encounters:  02/24/24 144 lb (65.3 kg)  10/25/23 140 lb 12.8 oz (63.9 kg)  07/07/23 140 lb 3.2 oz (63.6 kg)    General: Well developed, well nourished female in no apparent distress.  HEENT: AT/[place], no external lesions.  Eyes: Conjunctiva clear and no icterus. Neck: Neck supple  Lungs: Respirations not labored Neurologic: Alert, oriented, normal speech Extremities / Skin: Dry.  Psychiatric: Does not appear depressed or anxious  Diabetic Foot Exam - Simple   No data filed    LABS Reviewed Lab Results  Component Value Date   HGBA1C 6.1 (H) 02/20/2024   HGBA1C 5.9 (A) 10/25/2023   HGBA1C 6.3 06/20/2023   Lab Results  Component Value Date   FRUCTOSAMINE 297 (H) 04/13/2022   Lab Results  Component Value Date   CHOL 106 06/24/2023   HDL 42.80 06/24/2023   LDLCALC 43 06/24/2023   TRIG 99.0 06/24/2023   CHOLHDL 2 06/24/2023   Lab Results  Component Value Date   MICRALBCREAT 1.7 06/24/2023   MICRALBCREAT 3.3 04/08/2022   Lab Results  Component Value Date   CREATININE 0.77 02/20/2024   Lab Results  Component Value Date   GFR 77.20 06/20/2023    ASSESSMENT / PLAN  1. Controlled type 2 diabetes mellitus without complication, without long-term current use of insulin (HCC)   2. Hypercholesterolemia     Diabetes Mellitus type 2, no known complications. - Diabetic status / severity: Controlled  Lab Results  Component Value Date   HGBA1C 6.1 (H) 02/20/2024    - Hemoglobin A1c goal : <7%  Patient has improving and well-controlled type 2 diabetes mellitus.   - Medications: No  change.  I) continue Mounjaro 5 mg weekly. II) continue Jardiance 25 mg daily. III) continue metformin extended release 500 mg 2 times a day.  - Home glucose testing: Few times a week different times of the day.  - Discussed/ Gave Hypoglycemia treatment plan.  # Consult : not required at this time.   # Annual urine for microalbuminuria/ creatinine ratio, no microalbuminuria currently, continue ACE/ARB on telmisartan.    Lab Results  Component Value Date   MICRALBCREAT 1.7 06/24/2023    # Foot check nightly.  # Annual dilated diabetic eye exams, no diabetic retinopathy.   2. Blood pressure  -  BP Readings from Last 1 Encounters:  02/24/24 100/60    - Control is in target.  - No change in current plans.  3. Lipid status / Hyperlipidemia - Last  Lab Results  Component Value Date   LDLCALC 43 06/24/2023   - Continue atorvastatin 10 mg daily.  Diagnoses and all orders for this visit:  Controlled type 2 diabetes mellitus without complication, without long-term current use of insulin (HCC) -     Basic Metabolic Panel Without GFR -     Hemoglobin A1c -     Microalbumin / creatinine urine ratio  Hypercholesterolemia -     Lipid panel     DISPOSITION Follow up in clinic in 4  months suggested.  Labs prior to follow-up visit as ordered.   All questions answered and patient verbalized understanding of the plan.  [place] Jesselle Laflamme, MD [facility] Endocrinology [facility] [address] [place], [address] [place], [place] [address] Phone # [phone]  At least part of this note was generated using voice recognition software. Inadvertent word errors  may have occurred, which were not recognized during the proofreading process.

## 2024-03-13 ENCOUNTER — Other Ambulatory Visit (HOSPITAL_COMMUNITY): Payer: Self-pay

## 2024-04-23 ENCOUNTER — Other Ambulatory Visit (HOSPITAL_COMMUNITY): Payer: Self-pay

## 2024-05-15 ENCOUNTER — Other Ambulatory Visit (HOSPITAL_COMMUNITY): Payer: Self-pay

## 2024-05-23 ENCOUNTER — Other Ambulatory Visit (HOSPITAL_COMMUNITY): Payer: Self-pay

## 2024-05-23 ENCOUNTER — Other Ambulatory Visit: Payer: Self-pay

## 2024-05-23 ENCOUNTER — Other Ambulatory Visit: Payer: Self-pay | Admitting: Medical

## 2024-05-23 MED ORDER — VITAMIN D (ERGOCALCIFEROL) 1.25 MG (50000 UNIT) PO CAPS
50000.0000 [IU] | ORAL_CAPSULE | ORAL | 0 refills | Status: DC
  Filled 2024-05-23: qty 12, 84d supply, fill #0

## 2024-05-23 NOTE — Telephone Encounter (Signed)
 Pt is scheduled in August to see you

## 2024-05-29 ENCOUNTER — Ambulatory Visit (INDEPENDENT_AMBULATORY_CARE_PROVIDER_SITE_OTHER): Payer: Medicare PPO

## 2024-05-29 VITALS — BP 110/64 | HR 81 | Temp 98.1°F | Ht 64.0 in | Wt 147.6 lb

## 2024-05-29 DIAGNOSIS — E2839 Other primary ovarian failure: Secondary | ICD-10-CM | POA: Diagnosis not present

## 2024-05-29 DIAGNOSIS — Z0000 Encounter for general adult medical examination without abnormal findings: Secondary | ICD-10-CM

## 2024-05-29 NOTE — Progress Notes (Signed)
 Subjective:   Kelly Jordan is a 78 y.o. who presents for a Medicare Wellness preventive visit.  As a reminder, Annual Wellness Visits don't include a physical exam, and some assessments may be limited, especially if this visit is performed virtually. We may recommend an in-person follow-up visit with your provider if needed.  Visit Complete: In person    Persons Participating in Visit: Patient.  AWV Questionnaire: No: Patient Medicare AWV questionnaire was not completed prior to this visit.  Cardiac Risk Factors include: advanced age (>92men, >61 women);diabetes mellitus;hypertension     Objective:    Today's Vitals   05/29/24 1427  BP: 110/64  Pulse: 81  Temp: 98.1 F (36.7 C)  TempSrc: Oral  SpO2: 97%  Weight: 147 lb 9.6 oz (67 kg)  Height: 5' 4 (1.626 m)   Body mass index is 25.34 kg/m.     05/29/2024    2:33 PM 05/24/2023    2:47 PM 05/28/2022   10:06 AM  Advanced Directives  Does Patient Have a Medical Advance Directive? Yes Yes Yes  Type of Advance Directive Out of facility DNR (pink MOST or yellow form) Healthcare Power of [place];Living will Healthcare Power of [place];Living will  Copy of Healthcare Power of Attorney in Chart?  Yes - validated most recent copy scanned in chart (See row information) Yes - validated most recent copy scanned in chart (See row information)    Current Medications (verified) Outpatient Encounter Medications as of 05/29/2024  Medication Sig   aspirin EC 81 MG tablet Take 81 mg by mouth daily.   calcium -vitamin D  (OSCAL WITH D) 500-200 MG-UNIT tablet Take 1 tablet by mouth.   empagliflozin  (JARDIANCE ) 25 MG TABS tablet Take 1 tablet (25 mg total) by mouth daily before breakfast   glucose blood (ACCU-CHEK AVIVA PLUS) test strip TEST 2 TIMES A DAY IN VITRO 90 DAYS   metFORMIN  (GLUCOPHAGE -XR) 500 MG 24 hr tablet Take 1 tablet (500 mg total) by mouth 2 (two) times daily with a meal.   rosuvastatin  (CRESTOR ) 10 MG tablet Take  1 tablet (10 mg total) by mouth daily.   telmisartan  (MICARDIS ) 20 MG tablet Take 1 tablet (20 mg total) by mouth daily.   tirzepatide  (MOUNJARO ) 5 MG/0.5ML Pen Inject 5 mg into the skin once a week.   Vitamin D , Ergocalciferol , (DRISDOL ) 1.25 MG (50000 UNIT) CAPS capsule Take 1 capsule (50,000 Units total) by mouth every 7 (seven) days.   No facility-administered encounter medications on file as of 05/29/2024.    Allergies (verified) Patient has no known allergies.   History: Past Medical History:  Diagnosis Date   Cataract    Diabetes mellitus without complication (HCC)    Hyperlipidemia    Hypertension    Past Surgical History:  Procedure Laterality Date   Blefoplasty  Bilateral    BUNIONECTOMY Bilateral    CATARACT EXTRACTION Bilateral 2019   COLONOSCOPY  02/07/2014   POLYPECTOMY     ROTATOR CUFF REPAIR Right    TUBAL LIGATION     Family History  Problem Relation Age of Onset   Diabetes Sister    Colon cancer Neg Hx    Esophageal cancer Neg Hx    Rectal cancer Neg Hx    Stomach cancer Neg Hx    Colon polyps Neg Hx    Social History   Socioeconomic History   Marital status: Widowed    Spouse name: Not on file   Number of children: Not on file   Years  of education: Not on file   Highest education level: Not on file  Occupational History   Not on file  Tobacco Use   Smoking status: Former   Smokeless tobacco: Never   Tobacco comments:        Quit about 14 years ago.   Vaping Use   Vaping status: Never Used  Substance and Sexual Activity   Alcohol use: Yes    Comment: Occasional wine   Drug use: Never   Sexual activity: Not on file  Other Topics Concern   Not on file  Social History Narrative   Lives with son.   Has 3 children, 6 grandchildren.   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  Exercise  - walking and exercise at at [facility].   05/2022   Social Drivers of Health   Financial Resource Strain: Low Risk  (05/29/2024)   Overall Financial  Resource Strain (CARDIA)    Difficulty of Paying Living Expenses: Not hard at all  Food Insecurity: No Food Insecurity (05/29/2024)   Hunger Vital Sign    Worried About Running Out of Food in the Last Year: Never true    Ran Out of Food in the Last Year: Never true  Transportation Needs: No Transportation Needs (05/29/2024)   PRAPARE - Administrator, Civil Service (Medical): No    Lack of Transportation (Non-Medical): No  Physical Activity: Insufficiently Active (05/29/2024)   Exercise Vital Sign    Days of Exercise per Week: 3 days    Minutes of Exercise per Session: 40 min  Stress: No Stress Concern Present (05/29/2024)   [employer] of Occupational Health - Occupational Stress Questionnaire    Feeling of Stress: Not at all  Social Connections: Moderately Isolated (05/29/2024)   Social Connection and Isolation Panel    Frequency of Communication with Friends and Family: More than three times a week    Frequency of Social Gatherings with Friends and Family: More than three times a week    Attends Religious Services: More than 4 times per year    Active Member of [employer] or Organizations: No    Attends Banker Meetings: Never    Marital Status: Widowed    Tobacco Counseling Counseling given: Not Answered Tobacco comments:  Quit about 14 years ago.     Clinical Intake:  Pre-visit preparation completed: Yes  Pain : No/denies pain     Nutritional Status: BMI 25 -29 Overweight Nutritional Risks: None Diabetes: Yes CBG done?: No Did pt. bring in CBG monitor from home?: No  Lab Results  Component Value Date   HGBA1C 6.1 (H) 02/20/2024   HGBA1C 5.9 (A) 10/25/2023   HGBA1C 6.3 06/20/2023     How often do you need to have someone help you when you read instructions, pamphlets, or other written materials from your doctor or pharmacy?: 1 - Never  Interpreter Needed?: No  Information entered by :: NAllen LPN   Activities of Daily Living      05/29/2024    2:29 PM  In your present state of health, do you have any difficulty performing the following activities:  Hearing? 0  Vision? 0  Difficulty concentrating or making decisions? 0  Walking or climbing stairs? 0  Dressing or bathing? 0  Doing errands, shopping? 0  Preparing Food and eating ? N  Using the Toilet? N  In the past six months, have you accidently leaked urine? Y  Do you have problems with loss  of bowel control? N  Managing your Medications? N  Managing your Finances? N  Housekeeping or managing your Housekeeping? N    Patient Care Team: Tysinger, Alm RAMAN, PA-C as PCP - General (Family Medicine) Leslee Reusing, MD as Consulting Physician (Ophthalmology)  I have updated your Care Teams any recent Medical Services you may have received from other providers in the past year.     Assessment:   This is a routine wellness examination for Shereece.  Hearing/Vision screen Hearing Screening - Comments:: Denies hearing issues Vision Screening - Comments:: Regular eye exams, Dr. Leslee   Goals Addressed             This Visit's Progress    Patient Stated       05/29/2024, stay healthy       Depression Screen     05/29/2024    2:35 PM 05/24/2023    2:49 PM 05/28/2022   10:07 AM 04/16/2021    8:40 AM 01/12/2021    1:43 PM  PHQ 2/9 Scores  PHQ - 2 Score 0 0 0 0 0  PHQ- 9 Score 0 0 0      Fall Risk     05/29/2024    2:34 PM 05/24/2023    2:48 PM 05/28/2022   10:07 AM 04/16/2021    8:40 AM 01/12/2021    1:43 PM  Fall Risk   Falls in the past year? 0 0 0 0 0  Number falls in past yr: 0 0 0 0 0  Injury with Fall? 0 0 0 0 0  Risk for fall due to : Medication side effect Medication side effect Medication side effect No Fall Risks No Fall Risks  Follow up Falls evaluation completed;Falls prevention discussed Falls prevention discussed;Falls evaluation completed Falls evaluation completed;Education provided;Falls prevention discussed  Falls evaluation completed   Falls evaluation completed      Data saved with a previous flowsheet row definition    MEDICARE RISK AT HOME:  Medicare Risk at Home Any stairs in or around the home?: Yes If so, are there any without handrails?: No Home free of loose throw rugs in walkways, pet beds, electrical cords, etc?: Yes Adequate lighting in your home to reduce risk of falls?: Yes Life alert?: No Use of a cane, walker or w/c?: No Grab bars in the bathroom?: No Shower chair or bench in shower?: Yes Elevated toilet seat or a handicapped toilet?: Yes  TIMED UP AND GO:  Was the test performed?  Yes  Length of time to ambulate 10 feet: 5 sec Gait steady and fast without use of assistive device  Cognitive Function: 6CIT completed        05/29/2024    2:35 PM 05/24/2023    2:49 PM 05/28/2022   10:10 AM  6CIT Screen  What Year? 0 points 0 points 0 points  What month? 0 points 0 points 0 points  What time? 0 points 0 points 0 points  Count back from 20 0 points 0 points 0 points  Months in reverse 0 points 2 points 0 points  Repeat phrase 0 points 0 points 2 points  Total Score 0 points 2 points 2 points    Immunizations Immunization History  Administered Date(s) Administered   Hepatitis B 08/01/2002, 09/10/2002, 02/11/2003   Hepatitis B, ADULT 08/01/2002, 09/10/2002, 02/11/2003   Influenza, High Dose Seasonal PF 08/17/2023   Influenza, Quadrivalent, Recombinant, Inj, Pf 08/01/2017, 07/26/2018, 08/22/2019, 07/25/2020   Influenza-Unspecified 11/15/2012, 07/15/2022   Moderna Covid-19  Vaccine Bivalent Booster 57yrs & up 08/18/2021   PFIZER Comirnaty(Gray Top)Covid-19 Tri-Sucrose Vaccine 02/25/2021   PFIZER(Purple Top)SARS-COV-2 Vaccination 12/10/2019, 12/31/2019, 08/11/2020   PNEUMOCOCCAL CONJUGATE-20 06/03/2022   Pfizer(Comirnaty)Fall Seasonal Vaccine 12 years and older 09/17/2022   Pneumococcal Conjugate-13 10/30/2013   Pneumococcal Polysaccharide-23 02/20/2010, 04/16/2021   Pneumococcal-Unspecified  02/20/2010, 11/15/2010, 12/07/2018   Tdap 08/06/2009, 11/15/2010, 06/11/2021   Zoster Recombinant(Shingrix) 05/30/2021, 08/01/2021   Zoster, Live 03/12/2010, 11/15/2010, 10/27/2015    Screening Tests Health Maintenance  Topic Date Due   Diabetic kidney evaluation - Urine ACR  Never done   COVID-19 Vaccine (7 - 2024-25 season) 07/17/2023   INFLUENZA VACCINE  06/15/2024   OPHTHALMOLOGY EXAM  07/07/2024   HEMOGLOBIN A1C  08/21/2024   FOOT EXAM  10/24/2024   Diabetic kidney evaluation - eGFR measurement  02/19/2025   Medicare Annual Wellness (AWV)  05/29/2025   DTaP/Tdap/Td (4 - Td or Tdap) 06/12/2031   Pneumococcal Vaccine: 50+ Years  Completed   Hepatitis B Vaccines  Completed   DEXA SCAN  Completed   Hepatitis C Screening  Completed   Zoster Vaccines- Shingrix  Completed   HPV VACCINES  Aged Out   Meningococcal B Vaccine  Aged Out   Colonoscopy  Discontinued    Health Maintenance  Health Maintenance Due  Topic Date Due   Diabetic kidney evaluation - Urine ACR  Never done   COVID-19 Vaccine (7 - 2024-25 season) 07/17/2023   Health Maintenance Items Addressed: DEXA ordered, Declines covid vaccines.  Additional Screening:  Vision Screening: Recommended annual ophthalmology exams for early detection of glaucoma and other disorders of the eye. Would you like a referral to an eye doctor? No    Dental Screening: Recommended annual dental exams for proper oral hygiene  Community Resource Referral / Chronic Care Management: CRR required this visit?  No   CCM required this visit?  No   Plan:    I have personally reviewed and noted the following in the patient's chart:   Medical and social history Use of alcohol, tobacco or illicit drugs  Current medications and supplements including opioid prescriptions. Patient is not currently taking opioid prescriptions. Functional ability and status Nutritional status Physical activity Advanced directives List of other  physicians Hospitalizations, surgeries, and ER visits in previous 12 months Vitals Screenings to include cognitive, depression, and falls Referrals and appointments  In addition, I have reviewed and discussed with patient certain preventive protocols, quality metrics, and best practice recommendations. A written personalized care plan for preventive services as well as general preventive health recommendations were provided to patient.   Ardella FORBES Dawn, LPN   2/84/7974   After Visit Summary: (In Person-Printed) AVS printed and given to the patient  Notes: Nothing significant to report at this time.

## 2024-05-29 NOTE — Patient Instructions (Addendum)
 Ms. Boyar , Thank you for taking time out of your busy schedule to complete your Annual Wellness Visit with me. I enjoyed our conversation and look forward to speaking with you again next year. I, as well as your care team,  appreciate your ongoing commitment to your health goals. Please review the following plan we discussed and let me know if I can assist you in the future. Your Game plan/ To Do List    Referrals: If you haven't heard from the office you've been referred to, please reach out to them at the phone provided.  N/a Follow up Visits: Next Medicare AWV with our clinical staff: 06/11/2025 at 2:10   Have you seen your provider in the last 6 months (3 months if uncontrolled diabetes)? Yes Next Office Visit with your provider: 07/11/2024 at 10:15  Clinician Recommendations:  Aim for 30 minutes of exercise or brisk walking, 6-8 glasses of water, and 5 servings of fruits and vegetables each day.       This is a list of the screening recommended for you and due dates:  Health Maintenance  Topic Date Due   Yearly kidney health urinalysis for diabetes  Never done   COVID-19 Vaccine (7 - 2024-25 season) 07/17/2023   Flu Shot  06/15/2024   Eye exam for diabetics  07/07/2024   Hemoglobin A1C  08/21/2024   Complete foot exam   10/24/2024   Yearly kidney function blood test for diabetes  02/19/2025   Medicare Annual Wellness Visit  05/29/2025   DTaP/Tdap/Td vaccine (4 - Td or Tdap) 06/12/2031   Pneumococcal Vaccine for age over 39  Completed   Hepatitis B Vaccine  Completed   DEXA scan (bone density measurement)  Completed   Hepatitis C Screening  Completed   Zoster (Shingles) Vaccine  Completed   HPV Vaccine  Aged Out   Meningitis B Vaccine  Aged Out   Colon Cancer Screening  Discontinued    Advanced directives: (In Chart) A copy of your advanced directives are scanned into your chart should your provider ever need it. Advance Care Planning is important because it:  [x]  Makes sure  you receive the medical care that is consistent with your values, goals, and preferences  [x]  It provides guidance to your family and loved ones and reduces their decisional burden about whether or not they are making the right decisions based on your wishes.  Follow the link provided in your after visit summary or read over the paperwork we have mailed to you to help you started getting your Advance Directives in place. If you need assistance in completing these, please reach out to us  so that we can help you!  See attachments for Preventive Care and Fall Prevention Tips.

## 2024-06-25 ENCOUNTER — Other Ambulatory Visit

## 2024-06-25 DIAGNOSIS — E7800 Pure hypercholesterolemia, unspecified: Secondary | ICD-10-CM | POA: Diagnosis not present

## 2024-06-25 DIAGNOSIS — E119 Type 2 diabetes mellitus without complications: Secondary | ICD-10-CM | POA: Diagnosis not present

## 2024-06-26 ENCOUNTER — Ambulatory Visit: Payer: Self-pay | Admitting: Endocrinology

## 2024-06-27 LAB — LIPID PANEL
Cholesterol: 112 mg/dL (ref <200)
HDL: 51 mg/dL (ref >=50)
LDL Cholesterol (Calc): 46 mg/dL
Non-HDL Cholesterol (Calc): 61 mg/dL (ref <130)
Total CHOL/HDL Ratio: 2.2 (calc) (ref <5.0)
Triglycerides: 72 mg/dL (ref <150)

## 2024-06-27 LAB — BASIC METABOLIC PANEL WITHOUT GFR
BUN: 23 mg/dL (ref 7–25)
CO2: 28 mmol/L (ref 20–32)
Calcium: 9.7 mg/dL (ref 8.6–10.4)
Chloride: 106 mmol/L (ref 98–110)
Creat: 0.89 mg/dL (ref 0.60–1.00)
Glucose, Bld: 111 mg/dL — ABNORMAL HIGH (ref 65–99)
Potassium: 4.6 mmol/L (ref 3.5–5.3)
Sodium: 139 mmol/L (ref 135–146)

## 2024-06-27 LAB — HEMOGLOBIN A1C
Hgb A1c MFr Bld: 6.3 % — ABNORMAL HIGH (ref <5.7)
Mean Plasma Glucose: 134 mg/dL
eAG (mmol/L): 7.4 mmol/L

## 2024-06-27 LAB — MICROALBUMIN / CREATININE URINE RATIO
Creatinine, Urine: 78 mg/dL (ref 20–275)
Microalb Creat Ratio: 21 mg/g{creat} (ref <30)
Microalb, Ur: 1.6 mg/dL

## 2024-06-28 ENCOUNTER — Ambulatory Visit: Admitting: Endocrinology

## 2024-06-28 ENCOUNTER — Other Ambulatory Visit (HOSPITAL_COMMUNITY): Payer: Self-pay

## 2024-06-28 ENCOUNTER — Encounter: Payer: Self-pay | Admitting: Endocrinology

## 2024-06-28 DIAGNOSIS — Z7984 Long term (current) use of oral hypoglycemic drugs: Secondary | ICD-10-CM

## 2024-06-28 DIAGNOSIS — Z7985 Long-term (current) use of injectable non-insulin antidiabetic drugs: Secondary | ICD-10-CM

## 2024-06-28 DIAGNOSIS — E119 Type 2 diabetes mellitus without complications: Secondary | ICD-10-CM | POA: Diagnosis not present

## 2024-06-28 MED ORDER — EMPAGLIFLOZIN 25 MG PO TABS
25.0000 mg | ORAL_TABLET | Freq: Every day | ORAL | 3 refills | Status: DC
  Filled 2024-06-28 – 2024-08-21 (×3): qty 90, 90d supply, fill #0

## 2024-06-28 MED ORDER — METFORMIN HCL ER 500 MG PO TB24
500.0000 mg | ORAL_TABLET | Freq: Two times a day (BID) | ORAL | 3 refills | Status: AC
  Filled 2024-06-28 – 2024-08-15 (×2): qty 180, 90d supply, fill #0
  Filled 2024-11-13: qty 180, 90d supply, fill #1

## 2024-06-28 MED ORDER — TIRZEPATIDE 5 MG/0.5ML ~~LOC~~ SOAJ
5.0000 mg | SUBCUTANEOUS | 3 refills | Status: AC
  Filled 2024-06-28: qty 6, 84d supply, fill #0
  Filled 2024-09-25: qty 6, 84d supply, fill #1

## 2024-06-28 NOTE — Progress Notes (Signed)
 Outpatient Endocrinology Note Kelly Aviva Wolfer, MD  06/28/24  Patient's Name: Kelly Jordan    DOB: June 16, 1946    MRN: [id number]                                                    REASON OF VISIT: Follow up of type 2 diabetes mellitus  PCP: Bulah Alm RAMAN, PA-C  HISTORY OF PRESENT ILLNESS:   Kelly Jordan is a 78 y.o. old female with past medical history listed below, is here for  follow up of type 2 diabetes mellitus.   Pertinent Diabetes History: She was diagnosed with type 2 diabetes mellitus in 2010.  Chronic Diabetes Complications : Retinopathy: no. Last ophthalmology exam was done on 06/2023, reviewed note, annually. Nephropathy: no, on telmisartan  Peripheral neuropathy: No Coronary artery disease: No Stroke: No  Relevant comorbidities and cardiovascular risk factors: Obesity: no Body mass index is 25.16 kg/m.  Hypertension: Yes Hyperlipidemia.  Yes on a statin.  Current / Home Diabetic regimen includes: Mounjaro  5 mg weekly. Jardiance  25 mg daily. Metformin  extended release 500 mg 2 times a day.  Prior diabetic medications: Prandin , Farxiga  in the past. Rybelsus  up to 14 mg, stopped due to abdominal discomfort and heartburn.  Glycemic data:   She forgot to bring glucometer in the clinic today.  Reports although blood sugars are running normal.  Hypoglycemia: Patient has no hypoglycemic episodes. Patient has hypoglycemia awareness.  Factors modifying glucose control: 1.  Diabetic diet assessment: Eating healthy.  2.  Staying active or exercising: Walking.  3.  Medication compliance: compliant all of the time.  Interval history  Recent laboratory results reviewed, hemoglobin A1c 6.3%.  Normal renal function and electrolytes.  Acceptable cholesterol levels.  Normal urine microalbumin and creatinine ratio.  Diabetes stage manage reviewed and noted above.  Tolerating Mounjaro  well.  He has no other complaints today.  REVIEW OF SYSTEMS As per  history of present illness.   PAST MEDICAL HISTORY: Past Medical History:  Diagnosis Date   Cataract    Diabetes mellitus without complication (HCC)    Hyperlipidemia    Hypertension     PAST SURGICAL HISTORY: Past Surgical History:  Procedure Laterality Date   Blefoplasty  Bilateral    BUNIONECTOMY Bilateral    CATARACT EXTRACTION Bilateral 2019   COLONOSCOPY  02/07/2014   POLYPECTOMY     ROTATOR CUFF REPAIR Right    TUBAL LIGATION      ALLERGIES: No Known Allergies  FAMILY HISTORY:  Family History  Problem Relation Age of Onset   Diabetes Sister    Colon cancer Neg Hx    Esophageal cancer Neg Hx    Rectal cancer Neg Hx    Stomach cancer Neg Hx    Colon polyps Neg Hx     SOCIAL HISTORY: Social History   Socioeconomic History   Marital status: Widowed    Spouse name: Not on file   Number of children: Not on file   Years of education: Not on file   Highest education level: Not on file  Occupational History   Not on file  Tobacco Use   Smoking status: Former   Smokeless tobacco: Never   Tobacco comments:        Quit about 14 years ago.   Vaping Use   Vaping status: Never Used  Substance  and Sexual Activity   Alcohol use: Yes    Comment: Occasional wine   Drug use: Never   Sexual activity: Not on file  Other Topics Concern   Not on file  Social History Narrative   Lives with son.   Has 3 children, 6 grandchildren.   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  Exercise  - walking and exercise at at [facility].   05/2022   Social Drivers of Jordan   Financial Resource Strain: Low Risk  (05/29/2024)   Overall Financial Resource Strain (CARDIA)    Difficulty of Paying Living Expenses: Not hard at all  Food Insecurity: No Food Insecurity (05/29/2024)   Hunger Vital Sign    Worried About Running Out of Food in the Last Year: Never true    Ran Out of Food in the Last Year: Never true  Transportation Needs: No Transportation Needs (05/29/2024)   PRAPARE  - Administrator, Civil Service (Medical): No    Lack of Transportation (Non-Medical): No  Physical Activity: Insufficiently Active (05/29/2024)   Exercise Vital Sign    Days of Exercise per Week: 3 days    Minutes of Exercise per Session: 40 min  Stress: No Stress Concern Present (05/29/2024)   Kelly Jordan - Occupational Stress Questionnaire    Feeling of Stress: Not at all  Social Connections: Moderately Isolated (05/29/2024)   Social Connection and Isolation Panel    Frequency of Communication with Friends and Family: More than three times a week    Frequency of Social Gatherings with Friends and Family: More than three times a week    Attends Religious Services: More than 4 times per year    Active Member of [employer] or Organizations: No    Attends Banker Meetings: Never    Marital Status: Widowed    MEDICATIONS:  Current Outpatient Medications  Medication Sig Dispense Refill   aspirin EC 81 MG tablet Take 81 mg by mouth daily.     calcium-vitamin D (OSCAL WITH D) 500-200 MG-UNIT tablet Take 1 tablet by mouth.     glucose blood (ACCU-CHEK AVIVA PLUS) test strip TEST 2 TIMES A DAY IN VITRO 90 DAYS 200 strip 3   rosuvastatin (CRESTOR) 10 MG tablet Take 1 tablet (10 mg total) by mouth daily. 90 tablet 3   telmisartan (MICARDIS) 20 MG tablet Take 1 tablet (20 mg total) by mouth daily. 90 tablet 3   Vitamin D, Ergocalciferol, (DRISDOL) 1.25 MG (50000 UNIT) CAPS capsule Take 1 capsule (50,000 Units total) by mouth every 7 (seven) days. 12 capsule 0   empagliflozin (JARDIANCE) 25 MG TABS tablet Take 1 tablet (25 mg total) by mouth daily before breakfast 90 tablet 3   metFORMIN (GLUCOPHAGE-XR) 500 MG 24 hr tablet Take 1 tablet (500 mg total) by mouth 2 (two) times daily with a meal. 180 tablet 3   tirzepatide (MOUNJARO) 5 MG/0.5ML Pen Inject 5 mg into the skin once a week. 6 mL 3   No current facility-administered medications for this  visit.    PHYSICAL EXAM: Vitals:   06/28/24 1003  BP: 128/70  Pulse: 69  Resp: 20  SpO2: 96%  Weight: 146 lb 9.6 oz (66.5 kg)  Height: 5' 4 (1.626 m)   Body mass index is 25.16 kg/m.  Wt Readings from Last 3 Encounters:  06/28/24 146 lb 9.6 oz (66.5 kg)  05/29/24 147 lb 9.6 oz (67 kg)  02/24/24 144 lb (65.3  kg)    General: Well developed, well nourished female in no apparent distress.  HEENT: AT/[place], no external lesions.  Eyes: Conjunctiva clear and no icterus. Neck: Neck supple  Lungs: Respirations not labored Neurologic: Alert, oriented, normal speech Extremities / Skin: Dry.  Psychiatric: Does not appear depressed or anxious  Diabetic Foot Exam - Simple   No data filed    LABS Reviewed Lab Results  Component Value Date   HGBA1C 6.3 (H) 06/25/2024   HGBA1C 6.1 (H) 02/20/2024   HGBA1C 5.9 (A) 10/25/2023   Lab Results  Component Value Date   FRUCTOSAMINE 297 (H) 04/13/2022   Lab Results  Component Value Date   CHOL 112 06/25/2024   HDL 51 06/25/2024   LDLCALC 46 06/25/2024   TRIG 72 06/25/2024   CHOLHDL 2.2 06/25/2024   Lab Results  Component Value Date   MICRALBCREAT 21 06/25/2024   Lab Results  Component Value Date   CREATININE 0.89 06/25/2024   Lab Results  Component Value Date   GFR 77.20 06/20/2023    ASSESSMENT / PLAN  1. Controlled type 2 diabetes mellitus without complication, without long-term current use of insulin (HCC)     Diabetes Mellitus type 2, no known complications. - Diabetic status / severity: Controlled  Lab Results  Component Value Date   HGBA1C 6.3 (H) 06/25/2024    - Hemoglobin A1c goal : <6.5%  Patient has well-controlled type 2 diabetes mellitus.   - Medications: No change.  I) continue Mounjaro  5 mg weekly. II) continue Jardiance  25 mg daily. III) continue metformin  extended release 500 mg 2 times a day.  - Home glucose testing: Few times a week different times of the day.  - Discussed/ Gave  Hypoglycemia treatment plan.  # Consult : not required at this time.   # Annual urine for microalbuminuria/ creatinine ratio, no microalbuminuria currently, continue ACE/ARB on telmisartan .    Lab Results  Component Value Date   MICRALBCREAT 21 06/25/2024    # Foot check nightly.  # Annual dilated diabetic eye exams, no diabetic retinopathy.   2. Blood pressure  -  BP Readings from Last 1 Encounters:  06/28/24 128/70    - Control is in target.  - No change in current plans.  3. Lipid status / Hyperlipidemia - Last  Lab Results  Component Value Date   LDLCALC 46 06/25/2024   - Continue atorvastatin 10 mg daily.  Diagnoses and all orders for this visit:  Controlled type 2 diabetes mellitus without complication, without long-term current use of insulin (HCC) -     metFORMIN  (GLUCOPHAGE -XR) 500 MG 24 hr tablet; Take 1 tablet (500 mg total) by mouth 2 (two) times daily with a meal. -     empagliflozin  (JARDIANCE ) 25 MG TABS tablet; Take 1 tablet (25 mg total) by mouth daily before breakfast -     tirzepatide  (MOUNJARO ) 5 MG/0.5ML Pen; Inject 5 mg into the skin once a week.     DISPOSITION Follow up in clinic in 4  - 5 months suggested.    All questions answered and patient verbalized understanding of the plan.  Kelly Emmry Hinsch, MD [facility] Endocrinology [facility] [address] [place], [address] [place], [place] [address] Phone # [phone]  At least part of this note was generated using voice recognition software. Inadvertent word errors may have occurred, which were not recognized during the proofreading process.

## 2024-07-02 ENCOUNTER — Other Ambulatory Visit: Payer: Self-pay | Admitting: Medical

## 2024-07-03 ENCOUNTER — Other Ambulatory Visit (HOSPITAL_COMMUNITY): Payer: Self-pay

## 2024-07-03 MED ORDER — TELMISARTAN 20 MG PO TABS
20.0000 mg | ORAL_TABLET | Freq: Every day | ORAL | 0 refills | Status: AC
  Filled 2024-08-09: qty 90, 90d supply, fill #0
  Filled ????-??-??: fill #0

## 2024-07-03 NOTE — Telephone Encounter (Signed)
 Does she need to do a CPE with you?

## 2024-07-03 NOTE — Telephone Encounter (Signed)
 Called patient and left message asking patient to please call and schedule CPE w/Shane in the next 90 days-sent 90 day rx.

## 2024-07-04 ENCOUNTER — Telehealth: Payer: Self-pay

## 2024-07-04 ENCOUNTER — Other Ambulatory Visit: Payer: Self-pay | Admitting: Internal Medicine

## 2024-07-04 DIAGNOSIS — Z1231 Encounter for screening mammogram for malignant neoplasm of breast: Secondary | ICD-10-CM

## 2024-07-04 NOTE — Telephone Encounter (Signed)
 FYI

## 2024-07-10 ENCOUNTER — Ambulatory Visit: Payer: Self-pay | Admitting: Endocrinology

## 2024-07-10 DIAGNOSIS — E782 Mixed hyperlipidemia: Secondary | ICD-10-CM | POA: Diagnosis not present

## 2024-07-10 DIAGNOSIS — E1169 Type 2 diabetes mellitus with other specified complication: Secondary | ICD-10-CM | POA: Diagnosis not present

## 2024-07-10 DIAGNOSIS — Z961 Presence of intraocular lens: Secondary | ICD-10-CM | POA: Diagnosis not present

## 2024-07-10 DIAGNOSIS — E119 Type 2 diabetes mellitus without complications: Secondary | ICD-10-CM | POA: Diagnosis not present

## 2024-07-10 DIAGNOSIS — E559 Vitamin D deficiency, unspecified: Secondary | ICD-10-CM | POA: Diagnosis not present

## 2024-07-10 DIAGNOSIS — M810 Age-related osteoporosis without current pathological fracture: Secondary | ICD-10-CM | POA: Diagnosis not present

## 2024-07-10 DIAGNOSIS — I10 Essential (primary) hypertension: Secondary | ICD-10-CM | POA: Diagnosis not present

## 2024-07-10 LAB — HM DIABETES EYE EXAM

## 2024-07-11 ENCOUNTER — Ambulatory Visit: Payer: Medicare PPO | Admitting: Medical

## 2024-08-09 ENCOUNTER — Other Ambulatory Visit: Payer: Self-pay

## 2024-08-09 ENCOUNTER — Other Ambulatory Visit (HOSPITAL_COMMUNITY): Payer: Self-pay

## 2024-08-09 MED ORDER — ROSUVASTATIN CALCIUM 10 MG PO TABS
10.0000 mg | ORAL_TABLET | Freq: Every day | ORAL | 3 refills | Status: AC
  Filled 2024-08-09: qty 90, 90d supply, fill #0
  Filled 2024-11-13: qty 90, 90d supply, fill #1

## 2024-08-15 ENCOUNTER — Other Ambulatory Visit: Payer: Self-pay

## 2024-08-15 ENCOUNTER — Other Ambulatory Visit (HOSPITAL_COMMUNITY): Payer: Self-pay

## 2024-08-15 MED ORDER — JARDIANCE 25 MG PO TABS
25.0000 mg | ORAL_TABLET | Freq: Every morning | ORAL | 3 refills | Status: AC
  Filled 2024-08-15 – 2024-10-31 (×4): qty 90, 90d supply, fill #0

## 2024-08-15 MED ORDER — JARDIANCE 25 MG PO TABS
25.0000 mg | ORAL_TABLET | Freq: Every day | ORAL | 3 refills | Status: DC
  Filled 2024-08-15 – 2024-08-16 (×2): qty 90, 90d supply, fill #0

## 2024-08-16 ENCOUNTER — Other Ambulatory Visit (HOSPITAL_COMMUNITY): Payer: Self-pay

## 2024-08-16 ENCOUNTER — Ambulatory Visit
Admission: RE | Admit: 2024-08-16 | Discharge: 2024-08-16 | Disposition: A | Discharge status: Home / Self Care | Source: Ambulatory Visit | Attending: Internal Medicine | Admitting: Internal Medicine

## 2024-08-16 ENCOUNTER — Other Ambulatory Visit: Payer: Self-pay

## 2024-08-16 DIAGNOSIS — Z1231 Encounter for screening mammogram for malignant neoplasm of breast: Secondary | ICD-10-CM | POA: Diagnosis not present

## 2024-08-20 ENCOUNTER — Other Ambulatory Visit (HOSPITAL_COMMUNITY): Payer: Self-pay

## 2024-08-21 ENCOUNTER — Other Ambulatory Visit (HOSPITAL_COMMUNITY): Payer: Self-pay

## 2024-09-03 ENCOUNTER — Other Ambulatory Visit (HOSPITAL_COMMUNITY): Payer: Self-pay

## 2024-09-03 MED ORDER — BLOOD GLUCOSE TEST VI STRP
1.0000 | ORAL_STRIP | Freq: Every day | 3 refills | Status: AC
  Filled 2024-09-03: qty 50, 50d supply, fill #0
  Filled 2024-10-18 – 2024-10-31 (×2): qty 50, 50d supply, fill #1
  Filled 2024-12-13: qty 50, 50d supply, fill #2

## 2024-10-22 ENCOUNTER — Ambulatory Visit: Admitting: Endocrinology

## 2024-10-30 ENCOUNTER — Other Ambulatory Visit (HOSPITAL_COMMUNITY): Payer: Self-pay

## 2024-10-31 ENCOUNTER — Other Ambulatory Visit: Payer: Self-pay

## 2024-10-31 ENCOUNTER — Other Ambulatory Visit (HOSPITAL_COMMUNITY): Payer: Self-pay

## 2024-10-31 ENCOUNTER — Other Ambulatory Visit: Payer: Self-pay | Admitting: Medical

## 2024-10-31 NOTE — Telephone Encounter (Signed)
 Pt is overdue for a visit with Kelly Jordan. Please schedule. Once schedule I can refill medication for 30 days

## 2024-11-01 ENCOUNTER — Other Ambulatory Visit: Payer: Self-pay

## 2024-11-03 ENCOUNTER — Other Ambulatory Visit: Payer: Self-pay

## 2024-11-03 ENCOUNTER — Other Ambulatory Visit (HOSPITAL_COMMUNITY): Payer: Self-pay

## 2024-11-06 ENCOUNTER — Other Ambulatory Visit (HOSPITAL_COMMUNITY): Payer: Self-pay

## 2024-11-12 ENCOUNTER — Other Ambulatory Visit: Payer: Self-pay

## 2024-11-12 ENCOUNTER — Other Ambulatory Visit (HOSPITAL_COMMUNITY): Payer: Self-pay

## 2024-11-12 MED ORDER — TELMISARTAN 20 MG PO TABS
20.0000 mg | ORAL_TABLET | Freq: Every day | ORAL | 0 refills | Status: AC
  Filled 2024-11-12: qty 90, 90d supply, fill #0

## 2024-11-13 ENCOUNTER — Other Ambulatory Visit (HOSPITAL_COMMUNITY): Payer: Self-pay

## 2024-12-06 ENCOUNTER — Ambulatory Visit: Payer: Self-pay | Admitting: Endocrinology

## 2024-12-06 ENCOUNTER — Ambulatory Visit: Admitting: Endocrinology

## 2024-12-06 ENCOUNTER — Encounter: Payer: Self-pay | Admitting: Endocrinology

## 2024-12-06 VITALS — BP 126/60 | HR 83 | Resp 16 | Ht 64.0 in | Wt 145.2 lb

## 2024-12-06 DIAGNOSIS — Z7984 Long term (current) use of oral hypoglycemic drugs: Secondary | ICD-10-CM

## 2024-12-06 DIAGNOSIS — Z7985 Long-term (current) use of injectable non-insulin antidiabetic drugs: Secondary | ICD-10-CM | POA: Diagnosis not present

## 2024-12-06 DIAGNOSIS — E119 Type 2 diabetes mellitus without complications: Secondary | ICD-10-CM

## 2024-12-06 LAB — POCT GLYCOSYLATED HEMOGLOBIN (HGB A1C): Hemoglobin A1C: 6 % — AB (ref 4.0–5.6)

## 2024-12-06 NOTE — Progress Notes (Signed)
 "  Outpatient Endocrinology Note Kelly Corrigan, MD  12/06/24  Patient's Name: Kelly Jordan    DOB: Mar 26, 1946    MRN: [id number]                                                    REASON OF VISIT: Follow up of type 2 diabetes mellitus  PCP: Stephane Leita DEL, MD  HISTORY OF PRESENT ILLNESS:   Kelly Jordan is a 79 y.o. old female with past medical history listed below, is here for  follow up of type 2 diabetes mellitus.   Pertinent Diabetes History: She was diagnosed with type 2 diabetes mellitus in 2010.  Chronic Diabetes Complications : Retinopathy: no. Last ophthalmology exam was done on 06/2023, reviewed note, annually. Nephropathy: no, on telmisartan  Peripheral neuropathy: No Coronary artery disease: No Stroke: No  Relevant comorbidities and cardiovascular risk factors: Obesity: no Body mass index is 24.92 kg/m.  Hypertension: Yes Hyperlipidemia.  Yes on a statin.  Current / Home Diabetic regimen includes: Mounjaro  5 mg weekly. Jardiance  25 mg daily. Metformin  extended release 500 mg 2 times a day.  Prior diabetic medications: Prandin , Farxiga  in the past. Rybelsus  up to 14 mg, stopped due to abdominal discomfort and heartburn.  Glycemic data:   Glucose data reviewed directly from meter are 107, 98, 96, 112, 100, 123, 117, 143, 108, 126, 123, 105.  To goal.  Mostly taking in the morning fasting and occasionally in the afternoon and in the evening.  Hypoglycemia: Patient has no hypoglycemic episodes. Patient has hypoglycemia awareness.  Factors modifying glucose control: 1.  Diabetic diet assessment: Eating healthy.  2.  Staying active or exercising: Walking.  3.  Medication compliance: compliant all of the time.  Interval history  Glucometer data as reviewed above.  Hemoglobin A1c 6% today.  She has no numbness and tingling of the feet.  No vision problem.  She has no other complaints today.  She has been tolerating Mounjaro  well.   REVIEW OF  SYSTEMS As per history of present illness.   PAST MEDICAL HISTORY: Past Medical History:  Diagnosis Date   Cataract    Diabetes mellitus without complication (HCC)    Hyperlipidemia    Hypertension     PAST SURGICAL HISTORY: Past Surgical History:  Procedure Laterality Date   Blefoplasty  Bilateral    BUNIONECTOMY Bilateral    CATARACT EXTRACTION Bilateral 2019   COLONOSCOPY  02/07/2014   POLYPECTOMY     ROTATOR CUFF REPAIR Right    TUBAL LIGATION      ALLERGIES: No Known Allergies  FAMILY HISTORY:  Family History  Problem Relation Age of Onset   Diabetes Sister    Breast cancer Daughter 58   Colon cancer Neg Hx    Esophageal cancer Neg Hx    Rectal cancer Neg Hx    Stomach cancer Neg Hx    Colon polyps Neg Hx     SOCIAL HISTORY: Social History   Socioeconomic History   Marital status: Widowed    Spouse name: Not on file   Number of children: Not on file   Years of education: Not on file   Highest education level: Not on file  Occupational History   Not on file  Tobacco Use   Smoking status: Former   Smokeless tobacco: Never   Tobacco comments:  Quit about 14 years ago.   Vaping Use   Vaping status: Never Used  Substance and Sexual Activity   Alcohol use: Yes    Comment: Occasional wine   Drug use: Never   Sexual activity: Not on file  Other Topics Concern   Not on file  Social History Narrative   Lives with son.   Has 3 children, 6 grandchildren.   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  Exercise  - walking and exercise at at [facility].   05/2022   Social Drivers of Health   Tobacco Use: Medium Risk (12/06/2024)   Patient History    Smoking Tobacco Use: Former    Smokeless Tobacco Use: Never    Passive Exposure: Not on file  Financial Resource Strain: Low Risk (05/29/2024)   Overall Financial Resource Strain (CARDIA)    Difficulty of Paying Living Expenses: Not hard at all  Food Insecurity: No Food Insecurity (05/29/2024)   Epic     Worried About Programme Researcher, Broadcasting/film/video in the Last Year: Never true    Ran Out of Food in the Last Year: Never true  Transportation Needs: No Transportation Needs (05/29/2024)   Epic    Lack of Transportation (Medical): No    Lack of Transportation (Non-Medical): No  Physical Activity: Insufficiently Active (05/29/2024)   Exercise Vital Sign    Days of Exercise per Week: 3 days    Minutes of Exercise per Session: 40 min  Stress: No Stress Concern Present (05/29/2024)   [employer] of Occupational Health - Occupational Stress Questionnaire    Feeling of Stress: Not at all  Social Connections: Moderately Isolated (05/29/2024)   Social Connection and Isolation Panel    Frequency of Communication with Friends and Family: More than three times a week    Frequency of Social Gatherings with Friends and Family: More than three times a week    Attends Religious Services: More than 4 times per year    Active Member of [employer] or Organizations: No    Attends Banker Meetings: Never    Marital Status: Widowed  Depression (PHQ2-9): Low Risk (05/29/2024)   Depression (PHQ2-9)    PHQ-2 Score: 0  Alcohol Screen: Low Risk (05/29/2024)   Alcohol Screen    Last Alcohol Screening Score (AUDIT): 0  Housing: Unknown (05/29/2024)   Epic    Unable to Pay for Housing in the Last Year: No    Number of Times Moved in the Last Year: Not on file    Homeless in the Last Year: No  Utilities: Not At Risk (05/29/2024)   Epic    Threatened with loss of utilities: No  Health Literacy: Adequate Health Literacy (05/29/2024)   B1300 Health Literacy    Frequency of need for help with medical instructions: Never    MEDICATIONS:  Current Outpatient Medications  Medication Sig Dispense Refill   aspirin EC 81 MG tablet Take 81 mg by mouth daily.     calcium -vitamin D  (OSCAL WITH D) 500-200 MG-UNIT tablet Take 1 tablet by mouth.     empagliflozin  (JARDIANCE ) 25 MG TABS tablet Take 1 tablet (25 mg total) by  mouth every morning. 90 tablet 3   Ergocalciferol  50 MCG (2000 UT) CAPS 1,000 Oral; Duration: 84 days     Glucose Blood (BLOOD GLUCOSE TEST STRIPS) STRP Test once daily 100 strip 3   metFORMIN  (GLUCOPHAGE -XR) 500 MG 24 hr tablet Take 1 tablet (500 mg total) by mouth 2 (two) times  daily with a meal. 180 tablet 3   rosuvastatin  (CRESTOR ) 10 MG tablet Take 1 tablet (10 mg total) by mouth daily. 90 tablet 3   telmisartan  (MICARDIS ) 20 MG tablet Take 1 tablet (20 mg total) by mouth daily. 90 tablet 0   telmisartan  (MICARDIS ) 20 MG tablet Take 1 tablet (20 mg total) by mouth daily. 90 tablet 0   tirzepatide  (MOUNJARO ) 5 MG/0.5ML Pen Inject 5 mg into the skin once a week. 6 mL 3   glucose blood (ACCU-CHEK AVIVA PLUS) test strip TEST 2 TIMES A DAY IN VITRO 90 DAYS 200 strip 3   No current facility-administered medications for this visit.    PHYSICAL EXAM: Vitals:   12/06/24 1518  BP: 126/60  Pulse: 83  Resp: 16  SpO2: 98%  Weight: 145 lb 3.2 oz (65.9 kg)  Height: 5' 4 (1.626 m)    Body mass index is 24.92 kg/m.  Wt Readings from Last 3 Encounters:  12/06/24 145 lb 3.2 oz (65.9 kg)  06/28/24 146 lb 9.6 oz (66.5 kg)  05/29/24 147 lb 9.6 oz (67 kg)    General: Well developed, well nourished female in no apparent distress.  HEENT: AT/, no external lesions.  Eyes: Conjunctiva clear and no icterus. Neck: Neck supple  Lungs: Respirations not labored Neurologic: Alert, oriented, normal speech Extremities / Skin: Dry.  Psychiatric: Does not appear depressed or anxious  Diabetic Foot Exam - Simple   Simple Foot Form Diabetic Foot exam was performed with the following findings: Yes 12/06/2024  3:38 PM  Visual Inspection No deformities, no ulcerations, no other skin breakdown bilaterally: Yes See comments: Yes Sensation Testing Intact to touch and monofilament testing bilaterally: Yes Pulse Check Posterior Tibialis and Dorsalis pulse intact bilaterally: Yes Comments Mild callus  bilaterally on toes and left sole.     LABS Reviewed Lab Results  Component Value Date   HGBA1C 6.0 (A) 12/06/2024   HGBA1C 6.3 (H) 06/25/2024   HGBA1C 6.1 (H) 02/20/2024   Lab Results  Component Value Date   FRUCTOSAMINE 297 (H) 04/13/2022   Lab Results  Component Value Date   CHOL 112 06/25/2024   HDL 51 06/25/2024   LDLCALC 46 06/25/2024   TRIG 72 06/25/2024   CHOLHDL 2.2 06/25/2024   Lab Results  Component Value Date   MICRALBCREAT 21 06/25/2024   Lab Results  Component Value Date   CREATININE 0.89 06/25/2024   Lab Results  Component Value Date   GFR 77.20 06/20/2023    ASSESSMENT / PLAN  1. Controlled type 2 diabetes mellitus without complication, without long-term current use of insulin (HCC)    Diabetes Mellitus type 2, no known complications. - Diabetic status / severity: Controlled  Lab Results  Component Value Date   HGBA1C 6.0 (A) 12/06/2024    - Hemoglobin A1c goal : <6.5%  Patient has well-controlled type 2 diabetes mellitus.   - Medications: No change.  I) continue Mounjaro  5 mg weekly. II) continue Jardiance  25 mg daily. III) continue metformin  extended release 500 mg 2 times a day.  - Home glucose testing: Few times a week different times of the day.  - Discussed/ Gave Hypoglycemia treatment plan.  # Consult : not required at this time.   # Annual urine for microalbuminuria/ creatinine ratio, no microalbuminuria currently, continue ACE/ARB on telmisartan .    Lab Results  Component Value Date   MICRALBCREAT 21 06/25/2024    # Foot check nightly.  # Annual dilated diabetic eye exams, no  diabetic retinopathy.   2. Blood pressure  -  BP Readings from Last 1 Encounters:  12/06/24 126/60    - Control is in target.  - No change in current plans.  3. Lipid status / Hyperlipidemia - Last  Lab Results  Component Value Date   LDLCALC 46 06/25/2024   - Continue atorvastatin 10 mg daily.  Diagnoses and all orders for this  visit:  Controlled type 2 diabetes mellitus without complication, without long-term current use of insulin (HCC) -     POCT glycosylated hemoglobin (Hb A1C)    DISPOSITION Follow up in clinic in 6 months suggested.  Labs on the same day of the visit.    All questions answered and patient verbalized understanding of the plan.  Keon Benscoter, MD [facility] Endocrinology [facility] [address] [place], [address] [place], [place] [address] Phone # [phone]  At least part of this note was generated using voice recognition software. Inadvertent word errors may have occurred, which were not recognized during the proofreading process. "

## 2024-12-06 NOTE — Patient Instructions (Signed)
"   Latest Reference Range & Units 06/25/24 09:32 12/06/24 15:20  Hemoglobin A1C 4.0 - 5.6 % 6.3 (H) 6.0 !  (H): Data is abnormally high !: Data is abnormal "

## 2024-12-13 ENCOUNTER — Other Ambulatory Visit (HOSPITAL_COMMUNITY): Payer: Self-pay

## 2025-06-03 ENCOUNTER — Ambulatory Visit: Admitting: Endocrinology

## 2025-06-11 ENCOUNTER — Ambulatory Visit: Payer: Self-pay
